# Patient Record
Sex: Female | Born: 1955 | Race: White | Hispanic: No | Smoking: Never smoker
Health system: Southern US, Community
[De-identification: ages and names within clinical notes are randomized; demographics above are authoritative.]

## PROBLEM LIST (undated history)

## (undated) DIAGNOSIS — C499 Malignant neoplasm of connective and soft tissue, unspecified: Secondary | ICD-10-CM

## (undated) DIAGNOSIS — S4290XA Fracture of unspecified shoulder girdle, part unspecified, initial encounter for closed fracture: Secondary | ICD-10-CM

## (undated) HISTORY — DX: Malignant neoplasm of connective and soft tissue, unspecified: C49.9

## (undated) HISTORY — PX: CARPAL TUNNEL RELEASE: SHX101

## (undated) HISTORY — DX: Fracture of unspecified shoulder girdle, part unspecified, initial encounter for closed fracture: S42.90XA

## (undated) HISTORY — PX: COLOSTOMY: SHX63

## (undated) HISTORY — PX: ABDOMINAL HYSTERECTOMY: SHX81

## (undated) HISTORY — PX: PARTIAL COLECTOMY: SHX5273

## (undated) HISTORY — PX: BREAST BIOPSY: SHX20

---

## 2018-07-14 ENCOUNTER — Encounter (HOSPITAL_COMMUNITY): Payer: Self-pay | Admitting: Hematology

## 2018-07-14 ENCOUNTER — Inpatient Hospital Stay (HOSPITAL_COMMUNITY): Payer: BLUE CROSS/BLUE SHIELD

## 2018-07-14 ENCOUNTER — Other Ambulatory Visit: Payer: Self-pay

## 2018-07-14 ENCOUNTER — Encounter (HOSPITAL_COMMUNITY): Payer: Self-pay

## 2018-07-14 ENCOUNTER — Other Ambulatory Visit (HOSPITAL_COMMUNITY): Payer: Self-pay | Admitting: Nurse Practitioner

## 2018-07-14 ENCOUNTER — Inpatient Hospital Stay (HOSPITAL_COMMUNITY): Payer: BLUE CROSS/BLUE SHIELD | Attending: Hematology | Admitting: Hematology

## 2018-07-14 ENCOUNTER — Ambulatory Visit (HOSPITAL_COMMUNITY)
Admission: RE | Admit: 2018-07-14 | Discharge: 2018-07-14 | Disposition: A | Discharge status: Home / Self Care | Payer: BLUE CROSS/BLUE SHIELD | Source: Ambulatory Visit | Attending: Nurse Practitioner | Admitting: Nurse Practitioner

## 2018-07-14 VITALS — BP 105/59 | HR 114 | Resp 22

## 2018-07-14 DIAGNOSIS — N1330 Unspecified hydronephrosis: Secondary | ICD-10-CM | POA: Diagnosis not present

## 2018-07-14 DIAGNOSIS — C786 Secondary malignant neoplasm of retroperitoneum and peritoneum: Secondary | ICD-10-CM | POA: Insufficient documentation

## 2018-07-14 DIAGNOSIS — Z9221 Personal history of antineoplastic chemotherapy: Secondary | ICD-10-CM | POA: Diagnosis not present

## 2018-07-14 DIAGNOSIS — R188 Other ascites: Secondary | ICD-10-CM | POA: Diagnosis not present

## 2018-07-14 DIAGNOSIS — D72829 Elevated white blood cell count, unspecified: Secondary | ICD-10-CM | POA: Diagnosis not present

## 2018-07-14 DIAGNOSIS — R7989 Other specified abnormal findings of blood chemistry: Secondary | ICD-10-CM | POA: Diagnosis not present

## 2018-07-14 DIAGNOSIS — C549 Malignant neoplasm of corpus uteri, unspecified: Secondary | ICD-10-CM

## 2018-07-14 DIAGNOSIS — Z7901 Long term (current) use of anticoagulants: Secondary | ICD-10-CM | POA: Insufficient documentation

## 2018-07-14 DIAGNOSIS — Z79899 Other long term (current) drug therapy: Secondary | ICD-10-CM | POA: Insufficient documentation

## 2018-07-14 DIAGNOSIS — M7989 Other specified soft tissue disorders: Secondary | ICD-10-CM | POA: Diagnosis not present

## 2018-07-14 DIAGNOSIS — C763 Malignant neoplasm of pelvis: Secondary | ICD-10-CM | POA: Insufficient documentation

## 2018-07-14 DIAGNOSIS — Z90722 Acquired absence of ovaries, bilateral: Secondary | ICD-10-CM | POA: Diagnosis not present

## 2018-07-14 DIAGNOSIS — L03311 Cellulitis of abdominal wall: Secondary | ICD-10-CM | POA: Diagnosis not present

## 2018-07-14 DIAGNOSIS — Z7189 Other specified counseling: Secondary | ICD-10-CM

## 2018-07-14 DIAGNOSIS — Z90710 Acquired absence of both cervix and uterus: Secondary | ICD-10-CM | POA: Insufficient documentation

## 2018-07-14 DIAGNOSIS — I82411 Acute embolism and thrombosis of right femoral vein: Secondary | ICD-10-CM | POA: Insufficient documentation

## 2018-07-14 DIAGNOSIS — J90 Pleural effusion, not elsewhere classified: Secondary | ICD-10-CM | POA: Diagnosis not present

## 2018-07-14 DIAGNOSIS — J9811 Atelectasis: Secondary | ICD-10-CM | POA: Diagnosis not present

## 2018-07-14 DIAGNOSIS — Z933 Colostomy status: Secondary | ICD-10-CM | POA: Insufficient documentation

## 2018-07-14 DIAGNOSIS — R601 Generalized edema: Secondary | ICD-10-CM | POA: Diagnosis not present

## 2018-07-14 DIAGNOSIS — R180 Malignant ascites: Secondary | ICD-10-CM

## 2018-07-14 LAB — AMYLASE: AMYLASE: 33 U/L (ref 28–100)

## 2018-07-14 LAB — CBC WITH DIFFERENTIAL/PLATELET
BASOS ABS: 0 10*3/uL (ref 0.0–0.1)
BASOS PCT: 0 %
EOS ABS: 0 10*3/uL (ref 0.0–0.7)
Eosinophils Relative: 1 %
HCT: 28.4 % — ABNORMAL LOW (ref 36.0–46.0)
Hemoglobin: 9.3 g/dL — ABNORMAL LOW (ref 12.0–15.0)
Lymphocytes Relative: 9 %
Lymphs Abs: 0.5 10*3/uL — ABNORMAL LOW (ref 0.7–4.0)
MCH: 28.7 pg (ref 26.0–34.0)
MCHC: 32.7 g/dL (ref 30.0–36.0)
MCV: 87.7 fL (ref 78.0–100.0)
MONO ABS: 0.1 10*3/uL (ref 0.1–1.0)
Monocytes Relative: 1 %
Neutro Abs: 4.3 10*3/uL (ref 1.7–7.7)
Neutrophils Relative %: 89 %
Platelets: 163 10*3/uL (ref 150–400)
RBC: 3.24 MIL/uL — ABNORMAL LOW (ref 3.87–5.11)
RDW: 15.2 % (ref 11.5–15.5)
WBC: 4.8 10*3/uL (ref 4.0–10.5)

## 2018-07-14 LAB — COMPREHENSIVE METABOLIC PANEL
ALK PHOS: 51 U/L (ref 38–126)
ALT: 16 U/L (ref 0–44)
ANION GAP: 10 (ref 5–15)
AST: 245 U/L — AB (ref 15–41)
Albumin: 2.6 g/dL — ABNORMAL LOW (ref 3.5–5.0)
BILIRUBIN TOTAL: 0.6 mg/dL (ref 0.3–1.2)
BUN: 33 mg/dL — AB (ref 8–23)
CALCIUM: 8.1 mg/dL — AB (ref 8.9–10.3)
CO2: 19 mmol/L — ABNORMAL LOW (ref 22–32)
CREATININE: 0.94 mg/dL (ref 0.44–1.00)
Chloride: 104 mmol/L (ref 98–111)
GFR calc Af Amer: >60 mL/min (ref >60)
GFR calc non Af Amer: >60 mL/min (ref >60)
GLUCOSE: 101 mg/dL — AB (ref 70–99)
Potassium: 4.3 mmol/L (ref 3.5–5.1)
Sodium: 133 mmol/L — ABNORMAL LOW (ref 135–145)
TOTAL PROTEIN: 5.8 g/dL — AB (ref 6.5–8.1)

## 2018-07-14 LAB — APTT: APTT: 34 s (ref 24–36)

## 2018-07-14 LAB — PROTIME-INR
INR: 0.97
PROTHROMBIN TIME: 12.8 s (ref 11.4–15.2)

## 2018-07-14 LAB — MAGNESIUM: MAGNESIUM: 2.2 mg/dL (ref 1.7–2.4)

## 2018-07-14 LAB — PHOSPHORUS: Phosphorus: 3.7 mg/dL (ref 2.5–4.6)

## 2018-07-14 LAB — LACTATE DEHYDROGENASE: LDH: 1266 U/L — ABNORMAL HIGH (ref 98–192)

## 2018-07-14 MED ORDER — SODIUM CHLORIDE 0.9% FLUSH
10.0000 mL | INTRAVENOUS | Status: DC | PRN
  Administered 2018-07-14: 10 mL via INTRAVENOUS
  Filled 2018-07-14: qty 10

## 2018-07-14 MED ORDER — FUROSEMIDE 20 MG PO TABS: 20.0000 mg | ORAL_TABLET | Freq: Every day | ORAL | 1 refills | Status: DC | PRN

## 2018-07-14 MED ORDER — ALBUMIN HUMAN 25 % IV SOLN
25.0000 g | Freq: Once | INTRAVENOUS | Status: AC
  Administered 2018-07-14: 25 g via INTRAVENOUS
  Filled 2018-07-14: qty 100

## 2018-07-14 NOTE — Progress Notes (Signed)
Patient returns to [place] from ultrasound. They were unable to draw off any fluid from patient.  Orders received to give albumin 50 grams per Dr. Delton Coombes.   Patient is lying in bed supine with eyes closed. She is awake but states she is just tired and wants to keep her eyes closed. Patient denies any pain at this time.  She states that she just took her morhpine that is scheduled at this time.  Patient has family at bedside.  Patient has labored respirations.    Patient has an existing central line in right chest.  Patient and daughter state that it is cared for at home by the daughter that is a Designer, jewellery.    At the request of the daughter, both lumens of central line flushed with 10 ml normal saline.  Caps replaced and tightened.    Patient tolerated infusion without incidence. Patient discharged via wheelchair with family. In stable/fair condition.  Patient educated to report to ER should her breathing get any worse.  She is following up with her primary care physician this Friday and lab work will be drawn at that time. Patient to follow up here as scheduled.

## 2018-07-14 NOTE — Assessment & Plan Note (Signed)
1.  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  CT abdomen pelvis consistent with enlarged appendix, free pelvic fluid and enlarged fibroid uterus.  She underwent laparoscopic appendectomy with large pelvic mass noted intraoperatively.  GYN oncology was consulted. -On 05/19/2018 she underwent exploratory laparotomy and resection of large pelvic mass, TAH/BSO, partial colectomy with ostomy creation, placement of bilateral ureteral stents.  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 -She was evaluated by Dr. Angelina Ok at [facility] and pathology review noted high-grade sarcomatoid malignant neoplasm with rhabdomyosarcomatous differentiation - CT CAP at [facility] treatment [facility] [place] in [place] showed high density pelvic cystic structures which could represent hemorrhage or recurrence as well as peritoneal implants/carcinomatosis. -She was evaluated on 07/06/2018 at [facility] by Dr. Manus Rudd.  A CT scan on 07/06/2018 showed significant interval increase in metastatic disease within the abdomen and pelvis compared to 06/29/2018.  Interval development of minimal left pleural effusion and left lung base atelectasis.  Multiple small bowel loops are encased by tumor which is likely producing some small bowel obstruction although there were no markedly dilated loops of small bowel. - She received first cycle of chemotherapy on 07/09/2018 with vincristine 2 mg flat dose, doxorubicin 75 mg/m bolus with dexrazoxane 750 mg/m and cyclophosphamide 1200 mg/m.  This is to be cycled every 3 weeks for a total of 6 cycles.  She had a right upper chest double-lumen catheter placed for chemotherapy administration. - She transferred her care to our center  as it is close to where she lives.  She has developed swelling in her lower extremities and abdomen for the past few days.  She gets full after eating easily.  Does not report any abdominal pain.  She does have back pain for which she takes morphine long-acting and short-acting. - I have done blood work today in my office and reviewed it with the patient and her family.  Her albumin has decreased to 2.6 from 3.4 last week. -We have arranged for an ultrasound-guided paracentesis today with the radiology.  However the ultrasound found only minimal ascites.  She could not get any paracentesis done today.  Most likely cause of her abdominal distention is her tumor. - She did receive albumin 25 g in our office.  I have started her on Lasix 20 mg for her lower extremity swelling extending up to the thighs.  She also has abdominal swelling. -She will have labs drawn at Dr. Jerene Dilling office and have them faxed to [place].  I will see her back next week to check nadir counts. -She will have her second cycle of chemotherapy on 10-Aug-2018.  We plan to repeat CT scans after that.

## 2018-07-14 NOTE — Patient Instructions (Signed)
You received albumin today.  Please keep follow up appointments as scheduled.

## 2018-07-14 NOTE — Progress Notes (Signed)
[facility] NOTE  Patient Care Team: Eber Hong, MD as PCP - General (Internal Medicine)  CHIEF COMPLAINTS/PURPOSE OF CONSULTATION:  Continuation of chemotherapy close to home.  HISTORY OF PRESENTING ILLNESS:  Krystal Baker 62 y.o. female is seen in consultation today for further work-up and management of newly diagnosed extragonadal malignant mixed mullerian tumor.  She was well until mid June of this year when she was working a full-time job and was working out 4 times a week.  She presented to the emergency department on 05/16/2018 with abdominal pain, CT scan showed enlarged appendix with free pelvic fluid and enlarged fibroid uterus.  Laparoscopy showed large pelvic mass.  On 05/19/2018 she underwent exploratory laparotomy, resection of the large pelvic mass, TAH/BSO, partial colectomy with ostomy creation, placement of bilateral ureteral stents.  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  She was seen by Dr. Angelina Ok at [facility] and the pathology was felt to have rhabdomyosarcomatous potentiation.  She was also evaluated at [facility] treatment [facility] [place] in [place].  CT scan there showed high density pelvic cystic structures which represent hemorrhage or recurrence as well as peritoneal implants/carcinomatosis.  She was evaluated at [facility] on 07/06/2018 by Dr. Manus Rudd.  A CT scan on 07/06/2018 showed significant interval increase in metastatic disease within the abdomen and pelvis compared to the CT scan on 06/29/2018 with interval development of minimal left pleural effusion and left lung base atelectasis.  Multiple small bowel loops are encased by tumor which is likely producing some small bowel obstruction.  Pathology at [facility] was consistent with extragonadal malignant mixed mullerian tumor with predominantly rhabdomyosarcomatous component.  Because of rapid progression within few weeks, she was given first cycle of chemotherapy on  07/09/2018 with vincristine 2 mg flat dose, doxorubicin 75 mg/m bolus with dexrazoxane 750 mg/m and cyclophosphamide 1200 mg/m after a double-lumen catheter was placed on her right chest.  She has tolerated chemotherapy reasonably well.  She did not experience any major issues with nausea or vomiting.  She does have some fatigue.  She complained of lower extremity swelling extending up to her upper thigh starting 3 to 4 days ago.  She gets filled up easily after eating.  She denies any abdominal pain but feels discomfort.  She has history of chronic back pain for which she is on opioids. Family history is consistent with breast cancer in her sister, leukemia in her sister, maternal aunt and maternal uncle having lung cancer (both were smokers), mother had myelofibrosis, 2 maternal uncles had unknown type of cancers. She is accompanied by her daughter Altha Harm who works as a Economist at [employer]:  Past Medical History:  Diagnosis Date  . Broken shoulder    left  . Sarcoma [facility])     SURGICAL HISTORY: Past Surgical History:  Procedure Laterality Date  . BREAST BIOPSY    . CARPAL TUNNEL RELEASE      SOCIAL HISTORY: Social History   Socioeconomic History  . Marital status: Widowed    Spouse name: Not on file  . Number of children: 2  . Years of education: Not on file  . Highest education level: Not on file  Occupational History  . Not on file  Social Needs  . Financial resource strain: Not on file  . Food insecurity:    Worry: Not on file    Inability: Not on file  . Transportation needs:    Medical: Not on file  Non-medical: Not on file  Tobacco Use  . Smoking status: Never Smoker  . Smokeless tobacco: Never Used  Substance and Sexual Activity  . Alcohol use: Not Currently  . Drug use: Never  . Sexual activity: Not Currently  Lifestyle  . Physical activity:    Days per week: Not on file    Minutes per session: Not on file   . Stress: Not on file  Relationships  . Social connections:    Talks on phone: Not on file    Gets together: Not on file    Attends religious service: Not on file    Active member of club or organization: Not on file    Attends meetings of clubs or organizations: Not on file    Relationship status: Not on file  . Intimate partner violence:    Fear of current or ex partner: Not on file    Emotionally abused: Not on file    Physically abused: Not on file    Forced sexual activity: Not on file  Other Topics Concern  . Not on file  Social History Narrative  . Not on file    FAMILY HISTORY: Family History  Problem Relation Age of Onset  . Heart attack Father   . Leukemia Sister     ALLERGIES:  has No Known Allergies.  MEDICATIONS:  Current Outpatient Medications  Medication Sig Dispense Refill  . loratadine (CLARITIN) 10 MG tablet Take by mouth.    . morphine (MS CONTIN) 15 MG 12 hr tablet Take by mouth.    . morphine (MSIR) 15 MG tablet Take by mouth.    . OLANZapine (ZYPREXA) 5 MG tablet     . omeprazole (PRILOSEC) 20 MG capsule Take by mouth.    . ondansetron (ZOFRAN) 8 MG tablet Take by mouth.    . polyethylene glycol (MIRALAX / GLYCOLAX) packet Take by mouth.    . prochlorperazine (COMPAZINE) 10 MG tablet Take by mouth.    . senna-docusate (SENOKOT-S) 8.6-50 MG tablet Take by mouth.    . sodium bicarbonate/sodium chloride SOLN Swish and spit 10 mL every 4 (four) hours while awake.  Dissolve 2 teaspoon in 1 quart of water.    . sodium chloride 0.9 % injection Inject into the vein.    Marland Kitchen sucralfate (CARAFATE) 1 GM/10ML suspension Take by mouth.    . traMADol (ULTRAM) 50 MG tablet     . vitamin C (ASCORBIC ACID) 500 MG tablet Take by mouth.    . vitamin E 400 UNIT capsule Take by mouth.    . furosemide (LASIX) 20 MG tablet Take 1 tablet (20 mg total) by mouth daily as needed. 30 tablet 1   No current facility-administered medications for this visit.     Facility-Administered Medications Ordered in Other Visits  Medication Dose Route Frequency Provider Last Rate Last Dose  . sodium chloride flush (NS) 0.9 % injection 10 mL  10 mL Intravenous PRN Derek Jack, MD   10 mL at 07/14/18 1621    REVIEW OF SYSTEMS:   Constitutional: Denies fevers, chills or abnormal night sweats.  Positive for fatigue. Eyes: Denies blurriness of vision, double vision or watery eyes Ears, nose, mouth, throat, and face: Denies mucositis or sore throat Respiratory: Positive for dyspnea on exertion.   Cardiovascular: Denies palpitation, chest discomfort or lower extremity swelling Gastrointestinal: Positive for abdominal distention in the last few days. Skin: Denies abnormal skin rashes Lymphatics: Denies new lymphadenopathy or easy bruising Neurological:Denies numbness, tingling or new  weaknesses Behavioral/Psych: Mood is stable, no new changes  All other systems were reviewed with the patient and are negative.  PHYSICAL EXAMINATION: ECOG PERFORMANCE STATUS: 2 - Symptomatic, <50% confined to bed  I have reviewed her vitals.  Blood pressure is 109/57.  Pulse rate is 113.  Respiratory to 16.  Temperature is 98.  Oxygen saturations are 96%.  GENERAL:alert, no distress and comfortable SKIN: skin color, texture, turgor are normal, no rashes or significant lesions EYES: normal, conjunctiva are pink and non-injected, sclera clear OROPHARYNX:no exudate, no erythema and lips, buccal mucosa, and tongue normal  NECK: supple, thyroid normal size, non-tender, without nodularity LYMPH:  no palpable lymphadenopathy in the cervical, axillary or inguinal LUNGS: Bilateral air entry with decreased breath sounds in the left base. HEART: Regular rate and rhythm.  Tachycardia.  Bilateral lower extremity edema 2+ present.   ABDOMEN: Soft, distended.  Colostomy bag in the left lower quadrant.  Musculoskeletal:no cyanosis of digits and no clubbing  PSYCH: alert & oriented x  3 with fluent speech NEURO: no focal motor/sensory deficits  LABORATORY DATA:  I have reviewed the data as listed Lab Results  Component Value Date   WBC 4.8 07/14/2018   HGB 9.3 (L) 07/14/2018   HCT 28.4 (L) 07/14/2018   MCV 87.7 07/14/2018   PLT 163 07/14/2018     Chemistry      Component Value Date/Time   NA 133 (L) 07/14/2018 1301   K 4.3 07/14/2018 1301   CL 104 07/14/2018 1301   CO2 19 (L) 07/14/2018 1301   BUN 33 (H) 07/14/2018 1301   CREATININE 0.94 07/14/2018 1301      Component Value Date/Time   CALCIUM 8.1 (L) 07/14/2018 1301   ALKPHOS 51 07/14/2018 1301   AST 245 (H) 07/14/2018 1301   ALT 16 07/14/2018 1301   BILITOT 0.6 07/14/2018 1301       RADIOGRAPHIC STUDIES: I have personally reviewed the radiological images as listed and agreed with the findings in the report. [place] Ascites (abdomen Limited)  Result Date: 07/14/2018 CLINICAL DATA:  Malignant mixed mullerian tumor. EXAM: LIMITED ABDOMEN ULTRASOUND FOR ASCITES TECHNIQUE: Limited ultrasound survey for ascites was performed in all four abdominal quadrants. COMPARISON:  None. FINDINGS: Minimal amount of ascites is noted in the right upper quadrant around the liver. No ascites is noted in the other 3 quadrants of the abdomen. IMPRESSION: Minimal ascites is noted.  Paracentesis was not performed. Electronically Signed   By: Marijo Conception, M.D.   On: 07/14/2018 14:54    ASSESSMENT & PLAN:  MMMT (malignant mixed Mullerian tumor) ([place]) 1.  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  CT abdomen pelvis consistent with enlarged appendix, free pelvic fluid and enlarged fibroid uterus.  She underwent laparoscopic appendectomy with large pelvic mass noted  intraoperatively.  GYN oncology was consulted. -On 05/19/2018 she underwent exploratory laparotomy and resection of large pelvic mass, TAH/BSO, partial colectomy with ostomy creation, placement of bilateral ureteral stents.  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 -She was evaluated by Dr. Angelina Ok at [facility] and pathology review noted high-grade sarcomatoid malignant neoplasm with rhabdomyosarcomatous differentiation - CT CAP at [facility] [place] in [place] showed high density pelvic cystic structures which could represent hemorrhage or  recurrence as well as peritoneal implants/carcinomatosis. -She was evaluated on 07/06/2018 at [facility] by Dr. Manus Rudd.  A CT scan on 07/06/2018 showed significant interval increase in metastatic disease within the abdomen and pelvis compared to 06/29/2018.  Interval development of minimal left pleural effusion and left lung base atelectasis.  Multiple small bowel loops are encased by tumor which is likely producing some small bowel obstruction although there were no markedly dilated loops of small bowel. - She received first cycle of chemotherapy on 07/09/2018 with vincristine 2 mg flat dose, doxorubicin 75 mg/m bolus with dexrazoxane 750 mg/m and cyclophosphamide 1200 mg/m.  This is to be cycled every 3 weeks for a total of 6 cycles.  She had a right upper chest double-lumen catheter placed for chemotherapy administration. - She transferred her care to our center as it is close to where she lives.  She has developed swelling in her lower extremities and abdomen for the past few days.  She gets full after eating easily.  Does not report any abdominal pain.  She does have back pain for which she takes morphine long-acting and short-acting. - I have done blood work today in my office and reviewed it with the patient and her family.  Her albumin has decreased to 2.6 from 3.4 last week. -We have arranged for an ultrasound-guided paracentesis  today with the radiology.  However the ultrasound found only minimal ascites.  She could not get any paracentesis done today.  Most likely cause of her abdominal distention is her tumor. - She did receive albumin 25 g in our office.  I have started her on Lasix 20 mg for her lower extremity swelling extending up to the thighs.  She also has abdominal swelling. -She will have labs drawn at Dr. Jerene Dilling office and have them faxed to [place].  I will see her back next week to check nadir counts. -She will have her second cycle of chemotherapy on 08-15-2018.  We plan to repeat CT scans after that.  Orders Placed This Encounter  Procedures  . Magnesium    Standing Status:   Future    Number of Occurrences:   1    Standing Expiration Date:   07/15/2019  . CBC with Differential/Platelet    Standing Status:   Future    Number of Occurrences:   1    Standing Expiration Date:   07/15/2019  . Comprehensive metabolic panel    Standing Status:   Future    Number of Occurrences:   1    Standing Expiration Date:   07/15/2019  . Lactate dehydrogenase    Standing Status:   Future    Number of Occurrences:   1    Standing Expiration Date:   07/14/2019  . Phosphorus    Standing Status:   Future    Number of Occurrences:   1    Standing Expiration Date:   07/15/2019  . CA 125    Standing Status:   Future    Number of Occurrences:   1    Standing Expiration Date:   07/14/2019  . CEA    Standing Status:   Future    Number of Occurrences:   1    Standing Expiration Date:   07/14/2019  . APTT    Standing Status:   Future    Number of Occurrences:   1    Standing Expiration Date:   07/14/2019  . Protime-INR    Standing Status:   Future  Number of Occurrences:   1    Standing Expiration Date:   07/14/2019  . Amylase    Standing Status:   Future    Number of Occurrences:   1    Standing Expiration Date:   07/15/2019    All questions were answered. The patient knows to call the clinic with any problems,  questions or concerns.     Derek Jack, MD 07/14/2018 5:27 PM

## 2018-07-14 NOTE — Patient Instructions (Addendum)
[facility] at [facility] Discharge Instructions  You saw Dr. Katragadda today.   Thank you for choosing [facility] at [facility] to provide your oncology and hematology care.  To afford each patient quality time with our provider, please arrive at least 15 minutes before your scheduled appointment time.   If you have a lab appointment with the Cancer Center please come in thru the  Main Entrance and check in at the main information desk  You need to re-schedule your appointment should you arrive 10 or more minutes late.  We strive to give you quality time with our providers, and arriving late affects you and other patients whose appointments are after yours.  Also, if you no show three or more times for appointments you may be dismissed from the clinic at the providers discretion.     Again, thank you for choosing [facility].  Our hope is that these requests will decrease the amount of time that you wait before being seen by our physicians.       _____________________________________________________________  Should you have questions after your visit to [facility], please contact our office at [phone] between the hours of 8:00 a.m. and 4:30 p.m.  Voicemails left after 4:00 p.m. will not be returned until the following business day.  For prescription refill requests, have your pharmacy contact our office and allow 72 hours.    Cancer Center Support Programs:   > Cancer Support Group  2nd Tuesday of the month 1pm-2pm, Journey Room    

## 2018-07-15 ENCOUNTER — Encounter (HOSPITAL_COMMUNITY): Payer: Self-pay | Admitting: *Deleted

## 2018-07-15 LAB — CA 125: Cancer Antigen (CA) 125: 400 U/mL — ABNORMAL HIGH (ref 0.0–38.1)

## 2018-07-15 LAB — CEA: CEA: 0.6 ng/mL (ref 0.0–4.7)

## 2018-07-15 NOTE — Progress Notes (Signed)
I called Dr. Brynda Greathouse [phone]) in [place], [place] to ask that they draw labs on Mrs. Davalos at her appointment on Friday.  I spoke with Willia Craze, RN and asked that they fax [place] the results. I asked that they draw CBC w/diff, CMP, LDH, Magnesium and Phosphorus per Dr. Delton Coombes.

## 2018-07-16 ENCOUNTER — Encounter: Payer: Self-pay | Admitting: General Practice

## 2018-07-16 ENCOUNTER — Inpatient Hospital Stay (HOSPITAL_COMMUNITY): Payer: BLUE CROSS/BLUE SHIELD

## 2018-07-16 ENCOUNTER — Telehealth (HOSPITAL_COMMUNITY): Payer: Self-pay | Admitting: *Deleted

## 2018-07-16 VITALS — BP 116/61 | HR 115 | Temp 98.3°F | Resp 18

## 2018-07-16 DIAGNOSIS — C763 Malignant neoplasm of pelvis: Secondary | ICD-10-CM | POA: Diagnosis not present

## 2018-07-16 DIAGNOSIS — C549 Malignant neoplasm of corpus uteri, unspecified: Secondary | ICD-10-CM

## 2018-07-16 DIAGNOSIS — R110 Nausea: Secondary | ICD-10-CM

## 2018-07-16 LAB — COMPREHENSIVE METABOLIC PANEL
ALK PHOS: 39 U/L (ref 38–126)
ALT: 15 U/L (ref 0–44)
AST: 175 U/L — ABNORMAL HIGH (ref 15–41)
Albumin: 2.9 g/dL — ABNORMAL LOW (ref 3.5–5.0)
Anion gap: 10 (ref 5–15)
BUN: 28 mg/dL — AB (ref 8–23)
CALCIUM: 8 mg/dL — AB (ref 8.9–10.3)
CHLORIDE: 104 mmol/L (ref 98–111)
CO2: 20 mmol/L — AB (ref 22–32)
CREATININE: 0.92 mg/dL (ref 0.44–1.00)
GFR calc non Af Amer: >60 mL/min (ref >60)
Glucose, Bld: 109 mg/dL — ABNORMAL HIGH (ref 70–99)
Potassium: 4.1 mmol/L (ref 3.5–5.1)
SODIUM: 134 mmol/L — AB (ref 135–145)
Total Bilirubin: 0.8 mg/dL (ref 0.3–1.2)
Total Protein: 5.8 g/dL — ABNORMAL LOW (ref 6.5–8.1)

## 2018-07-16 LAB — CBC WITH DIFFERENTIAL/PLATELET
BASOS PCT: 0 %
Basophils Absolute: 0 10*3/uL (ref 0.0–0.1)
EOS ABS: 0 10*3/uL (ref 0.0–0.7)
EOS PCT: 5 %
HCT: 26.3 % — ABNORMAL LOW (ref 36.0–46.0)
Hemoglobin: 8.6 g/dL — ABNORMAL LOW (ref 12.0–15.0)
LYMPHS ABS: 0.2 10*3/uL (ref 0.7–4.0)
Lymphocytes Relative: 90 %
MCH: 28.2 pg (ref 26.0–34.0)
MCHC: 32.7 g/dL (ref 30.0–36.0)
MCV: 86.2 fL (ref 78.0–100.0)
MONO ABS: 0 10*3/uL (ref 0.1–1.0)
MONOS PCT: 0 %
Neutro Abs: 0 10*3/uL (ref 1.7–7.7)
Neutrophils Relative %: 5 %
PLATELETS: 71 10*3/uL — AB (ref 150–400)
RBC: 3.05 MIL/uL — ABNORMAL LOW (ref 3.87–5.11)
RDW: 14.9 % (ref 11.5–15.5)
WBC: 0.2 10*3/uL — CL (ref 4.0–10.5)

## 2018-07-16 LAB — MAGNESIUM: MAGNESIUM: 2.2 mg/dL (ref 1.7–2.4)

## 2018-07-16 LAB — LACTATE DEHYDROGENASE: LDH: 1237 U/L — ABNORMAL HIGH (ref 98–192)

## 2018-07-16 MED ORDER — SODIUM CHLORIDE 0.9% FLUSH
10.0000 mL | Freq: Once | INTRAVENOUS | Status: AC
  Administered 2018-07-16: 10 mL via INTRAVENOUS

## 2018-07-16 MED ORDER — SCOPOLAMINE 1 MG/3DAYS TD PT72
1.0000 | MEDICATED_PATCH | TRANSDERMAL | Status: DC
  Administered 2018-07-16: 1.5 mg via TRANSDERMAL
  Filled 2018-07-16: qty 1

## 2018-07-16 MED ORDER — SODIUM CHLORIDE 0.9 % IV SOLN
INTRAVENOUS | Status: DC
  Administered 2018-07-16: 500 mL via INTRAVENOUS

## 2018-07-16 MED ORDER — SCOPOLAMINE 1 MG/3DAYS TD PT72: 1.0000 | MEDICATED_PATCH | TRANSDERMAL | 12 refills | Status: DC

## 2018-07-16 MED ORDER — HEPARIN SOD (PORK) LOCK FLUSH 100 UNIT/ML IV SOLN
500.0000 [IU] | Freq: Once | INTRAVENOUS | Status: AC
  Administered 2018-07-16: 500 [IU] via INTRAVENOUS

## 2018-07-16 NOTE — Patient Instructions (Signed)
Krystal Baker at [facility] Discharge Instructions Call for any questions   Thank you for choosing [facility] to provide your oncology and hematology care.  To afford each patient quality time with our provider, please arrive at least 15 minutes before your scheduled appointment time.   If you have a lab appointment with the [facility] please come in thru the  Main Entrance and check in at the main information desk  You need to re-schedule your appointment should you arrive 10 or more minutes late.  We strive to give you quality time with our providers, and arriving late affects you and other patients whose appointments are after yours.  Also, if you no show three or more times for appointments you may be dismissed from the clinic at the providers discretion.     Again, thank you for choosing [facility].  Our hope is that these requests will decrease the amount of time that you wait before being seen by our physicians.       _____________________________________________________________  Should you have questions after your visit to [facility], please contact our office at [phone] between the hours of 8:00 a.m. and 4:30 p.m.  Voicemails left after 4:00 p.m. will not be returned until the following business day.  For prescription refill requests, have your pharmacy contact our office and allow 72 hours.    Cancer Center Support Programs:   > Cancer Support Group  2nd Tuesday of the month 1pm-2pm, Journey Room

## 2018-07-16 NOTE — Progress Notes (Signed)
[place] Psychosocial Distress Screening Clinical Social Work  Clinical Social Work was referred by distress screening protocol.  The patient scored a 10 on the Psychosocial Distress Thermometer which indicates severe distress. Clinical Social Worker contacted patient by phone to assess for distress and other psychosocial needs. Spoke w patient in car as she was on way to [facility] for tx today.  Has significant fatigue "its hard to even talk to you." Feels physically unable to access support resources including group - "when I feel better, that sounds good."  CSW will mail information packet to patient so she can review when able, encouraged her to call as needed for support and resources.    ONCBCN DISTRESS SCREENING 07/14/2018  Screening Type Initial Screening  Distress experienced in past week (1-10) 10  Emotional problem type Nervousness/Anxiety;Adjusting to illness  Physical Problem type Pain;Nausea/vomiting;Sleep/insomnia;Getting around;Bathing/dressing;Breathing;Loss of appetitie;Swollen arms/legs  Physician notified of physical symptoms Yes  Referral to clinical psychology No  Referral to clinical social work Yes  Referral to dietition No  Referral to financial advocate No  Referral to support programs No  Referral to palliative care No    Clinical Social Worker follow up needed: Yes.    Continue to monitor for needs.    If yes, follow up plan:  Beverely Pace, [place], LCSW Clinical Social Worker Phone:  [phone]

## 2018-07-16 NOTE — Progress Notes (Signed)
Patient assisted to room 410 with family at side.  Labs drawn from central line.  Patient resting with no s/s of distress noted.   CRITICAL VALUE STICKER  CRITICAL VALUE: WBC .2  RECEIVER (on-site recipient of call): Charlyne Petrin, RN  DATE & TIME NOTIFIED: 07/16/2018 1215  MESSENGER (representative from lab): Harland German  MD NOTIFIED: Delton Coombes  TIME OF NOTIFICATION:1225  RESPONSE: Patient received Neulasta in MD [place].  No orders for WBC count management today.  Patient to return on Monday for lab recheck.  Give a scopolamine patch today and send script to pharmacy verbal order Dr. Delton Coombes.   Patients daughter spoke with Dr. Delton Coombes on the phone for follow up care and white blood cell counts.   Patient tolerated hydration with no complaints voiced.  Scopolamine patch applied with script sent to pharmacy.  Reviewed neutropenia precautions with the patient and family with understanding verbalized.  Patient left by wheelchair with family with no s/s of distress noted.

## 2018-07-16 NOTE — Telephone Encounter (Signed)
Pt's daughter called stating that the pt can't keep anything down and has been that way since Tuesday afternoon. She stated that the pt is on Lasix and she is worried that the pt will get dehydrated.   I spoke to Dr. Raliegh Ip about above and he advised to give the pt a scope patch, make sure she is taking the nausea medications that have been given to her and to find out if the Lasix seems to be working.   Christin stated that the Lasix is working and that the pt is taking nausea medication at home. Francene Finders NP was going to send in the rx for the scope patch.  Pt was worked in for fluids today and is currently at our clinic.

## 2018-07-17 ENCOUNTER — Other Ambulatory Visit (HOSPITAL_COMMUNITY): Payer: Self-pay

## 2018-07-17 DIAGNOSIS — C549 Malignant neoplasm of corpus uteri, unspecified: Secondary | ICD-10-CM

## 2018-07-18 ENCOUNTER — Other Ambulatory Visit: Payer: Self-pay

## 2018-07-18 ENCOUNTER — Other Ambulatory Visit (HOSPITAL_COMMUNITY): Payer: Self-pay

## 2018-07-18 ENCOUNTER — Encounter (HOSPITAL_COMMUNITY): Payer: Self-pay | Admitting: Emergency Medicine

## 2018-07-18 ENCOUNTER — Inpatient Hospital Stay (HOSPITAL_COMMUNITY)
Admission: EM | Admit: 2018-07-18 | Discharge: 2018-07-21 | DRG: 863 | Disposition: A | Discharge status: Home / Self Care | Payer: BLUE CROSS/BLUE SHIELD | Attending: Internal Medicine | Admitting: Internal Medicine

## 2018-07-18 ENCOUNTER — Emergency Department (HOSPITAL_COMMUNITY): Payer: BLUE CROSS/BLUE SHIELD

## 2018-07-18 DIAGNOSIS — N179 Acute kidney failure, unspecified: Secondary | ICD-10-CM | POA: Diagnosis present

## 2018-07-18 DIAGNOSIS — E872 Acidosis, unspecified: Secondary | ICD-10-CM | POA: Diagnosis present

## 2018-07-18 DIAGNOSIS — Z79899 Other long term (current) drug therapy: Secondary | ICD-10-CM | POA: Diagnosis not present

## 2018-07-18 DIAGNOSIS — Z933 Colostomy status: Secondary | ICD-10-CM | POA: Diagnosis not present

## 2018-07-18 DIAGNOSIS — R1900 Intra-abdominal and pelvic swelling, mass and lump, unspecified site: Secondary | ICD-10-CM | POA: Diagnosis present

## 2018-07-18 DIAGNOSIS — C799 Secondary malignant neoplasm of unspecified site: Secondary | ICD-10-CM

## 2018-07-18 DIAGNOSIS — I824Y1 Acute embolism and thrombosis of unspecified deep veins of right proximal lower extremity: Secondary | ICD-10-CM | POA: Diagnosis not present

## 2018-07-18 DIAGNOSIS — D701 Agranulocytosis secondary to cancer chemotherapy: Secondary | ICD-10-CM | POA: Diagnosis present

## 2018-07-18 DIAGNOSIS — T8149XA Infection following a procedure, other surgical site, initial encounter: Secondary | ICD-10-CM | POA: Diagnosis present

## 2018-07-18 DIAGNOSIS — Z79891 Long term (current) use of opiate analgesic: Secondary | ICD-10-CM | POA: Diagnosis not present

## 2018-07-18 DIAGNOSIS — T451X5A Adverse effect of antineoplastic and immunosuppressive drugs, initial encounter: Secondary | ICD-10-CM | POA: Diagnosis present

## 2018-07-18 DIAGNOSIS — R600 Localized edema: Secondary | ICD-10-CM

## 2018-07-18 DIAGNOSIS — Z9049 Acquired absence of other specified parts of digestive tract: Secondary | ICD-10-CM | POA: Diagnosis not present

## 2018-07-18 DIAGNOSIS — R601 Generalized edema: Secondary | ICD-10-CM | POA: Diagnosis not present

## 2018-07-18 DIAGNOSIS — Z7401 Bed confinement status: Secondary | ICD-10-CM | POA: Diagnosis not present

## 2018-07-18 DIAGNOSIS — L03311 Cellulitis of abdominal wall: Secondary | ICD-10-CM | POA: Diagnosis present

## 2018-07-18 DIAGNOSIS — I82401 Acute embolism and thrombosis of unspecified deep veins of right lower extremity: Secondary | ICD-10-CM | POA: Diagnosis present

## 2018-07-18 DIAGNOSIS — Z66 Do not resuscitate: Secondary | ICD-10-CM | POA: Diagnosis present

## 2018-07-18 DIAGNOSIS — R112 Nausea with vomiting, unspecified: Secondary | ICD-10-CM | POA: Diagnosis not present

## 2018-07-18 DIAGNOSIS — E8809 Other disorders of plasma-protein metabolism, not elsewhere classified: Secondary | ICD-10-CM | POA: Diagnosis present

## 2018-07-18 DIAGNOSIS — Z90710 Acquired absence of both cervix and uterus: Secondary | ICD-10-CM

## 2018-07-18 DIAGNOSIS — I82411 Acute embolism and thrombosis of right femoral vein: Secondary | ICD-10-CM | POA: Diagnosis present

## 2018-07-18 DIAGNOSIS — C549 Malignant neoplasm of corpus uteri, unspecified: Secondary | ICD-10-CM | POA: Diagnosis present

## 2018-07-18 DIAGNOSIS — C499 Malignant neoplasm of connective and soft tissue, unspecified: Secondary | ICD-10-CM | POA: Diagnosis not present

## 2018-07-18 DIAGNOSIS — Z90722 Acquired absence of ovaries, bilateral: Secondary | ICD-10-CM | POA: Diagnosis not present

## 2018-07-18 DIAGNOSIS — C786 Secondary malignant neoplasm of retroperitoneum and peritoneum: Secondary | ICD-10-CM | POA: Diagnosis present

## 2018-07-18 DIAGNOSIS — Z806 Family history of leukemia: Secondary | ICD-10-CM | POA: Diagnosis not present

## 2018-07-18 LAB — CBC WITH DIFFERENTIAL/PLATELET
BASOS ABS: 0 10*3/uL (ref 0.0–0.1)
BASOS PCT: 0 %
EOS ABS: 0 10*3/uL (ref 0.0–0.7)
Eosinophils Relative: 2 %
HCT: 25.7 % — ABNORMAL LOW (ref 36.0–46.0)
HEMOGLOBIN: 8.4 g/dL — AB (ref 12.0–15.0)
Lymphocytes Relative: 42 %
Lymphs Abs: 0.3 10*3/uL (ref 0.7–4.0)
MCH: 27.8 pg (ref 26.0–34.0)
MCHC: 32.7 g/dL (ref 30.0–36.0)
MCV: 85.1 fL (ref 78.0–100.0)
Monocytes Absolute: 0.2 10*3/uL (ref 0.1–1.0)
Monocytes Relative: 32 %
Neutro Abs: 0.2 10*3/uL (ref 1.7–7.7)
Neutrophils Relative %: 24 %
Platelets: 68 10*3/uL — ABNORMAL LOW (ref 150–400)
RBC: 3.02 MIL/uL — AB (ref 3.87–5.11)
RDW: 14.8 % (ref 11.5–15.5)
WBC: 0.7 10*3/uL — AB (ref 4.0–10.5)

## 2018-07-18 LAB — COMPREHENSIVE METABOLIC PANEL
ALK PHOS: 40 U/L (ref 38–126)
ALT: 15 U/L (ref 0–44)
AST: 161 U/L — ABNORMAL HIGH (ref 15–41)
Albumin: 2.7 g/dL — ABNORMAL LOW (ref 3.5–5.0)
Anion gap: 11 (ref 5–15)
BUN: 34 mg/dL — ABNORMAL HIGH (ref 8–23)
CALCIUM: 8.2 mg/dL — AB (ref 8.9–10.3)
CO2: 20 mmol/L — ABNORMAL LOW (ref 22–32)
CREATININE: 1.24 mg/dL — AB (ref 0.44–1.00)
Chloride: 103 mmol/L (ref 98–111)
GFR, EST AFRICAN AMERICAN: 53 mL/min — AB (ref >60)
GFR, EST NON AFRICAN AMERICAN: 46 mL/min — AB (ref >60)
Glucose, Bld: 96 mg/dL (ref 70–99)
Potassium: 4.1 mmol/L (ref 3.5–5.1)
Sodium: 134 mmol/L — ABNORMAL LOW (ref 135–145)
TOTAL PROTEIN: 5.9 g/dL — AB (ref 6.5–8.1)
Total Bilirubin: 0.6 mg/dL (ref 0.3–1.2)

## 2018-07-18 LAB — URINALYSIS, ROUTINE W REFLEX MICROSCOPIC
BILIRUBIN URINE: NEGATIVE
Glucose, UA: NEGATIVE mg/dL
HGB URINE DIPSTICK: NEGATIVE
KETONES UR: NEGATIVE mg/dL
LEUKOCYTES UA: NEGATIVE
Nitrite: NEGATIVE
PH: 5 (ref 5.0–8.0)
Protein, ur: 30 mg/dL — AB
Specific Gravity, Urine: 1.018 (ref 1.005–1.030)

## 2018-07-18 LAB — I-STAT CG4 LACTIC ACID, ED
Lactic Acid, Venous: 2.58 mmol/L (ref 0.5–1.9)
Lactic Acid, Venous: 1.69 mmol/L (ref 0.5–1.9)

## 2018-07-18 MED ORDER — PROCHLORPERAZINE MALEATE 5 MG PO TABS
10.0000 mg | ORAL_TABLET | Freq: Four times a day (QID) | ORAL | Status: DC
  Administered 2018-07-19 – 2018-07-21 (×11): 10 mg via ORAL
  Filled 2018-07-18 (×11): qty 2

## 2018-07-18 MED ORDER — SODIUM BICARBONATE/SODIUM CHLORIDE MOUTHWASH
OROMUCOSAL | Status: DC | PRN
  Filled 2018-07-18: qty 1000

## 2018-07-18 MED ORDER — LORATADINE 10 MG PO TABS: 10.0000 mg | ORAL_TABLET | Freq: Every day | ORAL | Status: DC | PRN

## 2018-07-18 MED ORDER — SODIUM CHLORIDE 0.9 % IV SOLN
2.0000 g | Freq: Once | INTRAVENOUS | Status: AC
  Administered 2018-07-18: 2 g via INTRAVENOUS
  Filled 2018-07-18: qty 2

## 2018-07-18 MED ORDER — POLYETHYLENE GLYCOL 3350 17 G PO PACK: 17.0000 g | PACK | Freq: Every day | ORAL | Status: DC | PRN

## 2018-07-18 MED ORDER — TRAMADOL HCL 50 MG PO TABS: 50.0000 mg | ORAL_TABLET | Freq: Four times a day (QID) | ORAL | Status: DC | PRN

## 2018-07-18 MED ORDER — IOPAMIDOL (ISOVUE-300) INJECTION 61%
100.0000 mL | Freq: Once | INTRAVENOUS | Status: AC | PRN
  Administered 2018-07-18: 100 mL via INTRAVENOUS

## 2018-07-18 MED ORDER — VANCOMYCIN HCL IN DEXTROSE 750-5 MG/150ML-% IV SOLN
750.0000 mg | Freq: Two times a day (BID) | INTRAVENOUS | Status: DC
  Filled 2018-07-18 (×3): qty 150

## 2018-07-18 MED ORDER — ACETAMINOPHEN 325 MG PO TABS: 650.0000 mg | ORAL_TABLET | Freq: Four times a day (QID) | ORAL | Status: DC | PRN

## 2018-07-18 MED ORDER — SODIUM CHLORIDE 0.9 % IV BOLUS
1000.0000 mL | Freq: Once | INTRAVENOUS | Status: AC
  Administered 2018-07-18: 1000 mL via INTRAVENOUS

## 2018-07-18 MED ORDER — METRONIDAZOLE IN NACL 5-0.79 MG/ML-% IV SOLN
500.0000 mg | Freq: Three times a day (TID) | INTRAVENOUS | Status: DC
Start: 2018-07-18 — End: 2018-07-19
  Administered 2018-07-18 – 2018-07-19 (×2): 500 mg via INTRAVENOUS
  Filled 2018-07-18 (×2): qty 100

## 2018-07-18 MED ORDER — ONDANSETRON HCL 4 MG/2ML IJ SOLN
4.0000 mg | Freq: Once | INTRAMUSCULAR | Status: AC
  Administered 2018-07-18: 4 mg via INTRAVENOUS
  Filled 2018-07-18: qty 2

## 2018-07-18 MED ORDER — SODIUM CHLORIDE 0.9 % IV SOLN
INTRAVENOUS | Status: DC
  Administered 2018-07-19: via INTRAVENOUS

## 2018-07-18 MED ORDER — SENNOSIDES-DOCUSATE SODIUM 8.6-50 MG PO TABS
1.0000 | ORAL_TABLET | Freq: Every evening | ORAL | Status: DC | PRN
  Administered 2018-07-20: 1 via ORAL
  Filled 2018-07-18: qty 1

## 2018-07-18 MED ORDER — FAMOTIDINE IN NACL 20-0.9 MG/50ML-% IV SOLN
20.0000 mg | Freq: Two times a day (BID) | INTRAVENOUS | Status: DC
  Administered 2018-07-19 – 2018-07-20 (×3): 20 mg via INTRAVENOUS
  Filled 2018-07-18 (×3): qty 50

## 2018-07-18 MED ORDER — ACETAMINOPHEN 650 MG RE SUPP: 650.0000 mg | Freq: Four times a day (QID) | RECTAL | Status: DC | PRN

## 2018-07-18 MED ORDER — ENOXAPARIN SODIUM 40 MG/0.4ML ~~LOC~~ SOLN
40.0000 mg | SUBCUTANEOUS | Status: DC
  Administered 2018-07-19: 40 mg via SUBCUTANEOUS
  Filled 2018-07-18: qty 0.4

## 2018-07-18 MED ORDER — MORPHINE SULFATE (PF) 4 MG/ML IV SOLN
8.0000 mg | Freq: Once | INTRAVENOUS | Status: AC
Start: 2018-07-18 — End: 2018-07-18
  Administered 2018-07-18: 8 mg via INTRAVENOUS
  Filled 2018-07-18: qty 2

## 2018-07-18 MED ORDER — SCOPOLAMINE 1 MG/3DAYS TD PT72
1.0000 | MEDICATED_PATCH | TRANSDERMAL | Status: DC
  Filled 2018-07-18: qty 1

## 2018-07-18 MED ORDER — OLANZAPINE 5 MG PO TABS
5.0000 mg | ORAL_TABLET | Freq: Two times a day (BID) | ORAL | Status: DC
  Administered 2018-07-19 – 2018-07-21 (×6): 5 mg via ORAL
  Filled 2018-07-18 (×7): qty 1

## 2018-07-18 MED ORDER — ONDANSETRON HCL 4 MG PO TABS
8.0000 mg | ORAL_TABLET | Freq: Three times a day (TID) | ORAL | Status: DC | PRN
  Administered 2018-07-20: 8 mg via ORAL
  Filled 2018-07-18: qty 2

## 2018-07-18 MED ORDER — METOCLOPRAMIDE HCL 5 MG/ML IJ SOLN
10.0000 mg | Freq: Four times a day (QID) | INTRAMUSCULAR | Status: DC
Start: 2018-07-19 — End: 2018-07-21
  Administered 2018-07-19 – 2018-07-21 (×11): 10 mg via INTRAVENOUS
  Filled 2018-07-18 (×11): qty 2

## 2018-07-18 MED ORDER — TBO-FILGRASTIM 480 MCG/0.8ML ~~LOC~~ SOSY
480.0000 ug | PREFILLED_SYRINGE | Freq: Once | SUBCUTANEOUS | Status: DC
  Filled 2018-07-18: qty 0.8

## 2018-07-18 MED ORDER — TAMSULOSIN HCL 0.4 MG PO CAPS
0.4000 mg | ORAL_CAPSULE | Freq: Every day | ORAL | Status: DC
  Administered 2018-07-21: 0.4 mg via ORAL
  Filled 2018-07-18 (×3): qty 1

## 2018-07-18 MED ORDER — PNEUMOCOCCAL VAC POLYVALENT 25 MCG/0.5ML IJ INJ
0.5000 mL | INJECTION | INTRAMUSCULAR | Status: DC
  Filled 2018-07-18: qty 0.5

## 2018-07-18 MED ORDER — SODIUM CHLORIDE 0.9 % IV SOLN
1.0000 g | Freq: Two times a day (BID) | INTRAVENOUS | Status: DC
  Administered 2018-07-19: 1 g via INTRAVENOUS
  Filled 2018-07-18 (×3): qty 1

## 2018-07-18 MED ORDER — MORPHINE SULFATE ER 15 MG PO TBCR
15.0000 mg | EXTENDED_RELEASE_TABLET | Freq: Two times a day (BID) | ORAL | Status: DC
Start: 2018-07-18 — End: 2018-07-21
  Administered 2018-07-19 – 2018-07-21 (×6): 15 mg via ORAL
  Filled 2018-07-18 (×5): qty 1

## 2018-07-18 MED ORDER — VANCOMYCIN HCL IN DEXTROSE 1-5 GM/200ML-% IV SOLN
1000.0000 mg | Freq: Once | INTRAVENOUS | Status: AC
  Administered 2018-07-18: 1000 mg via INTRAVENOUS
  Filled 2018-07-18: qty 200

## 2018-07-18 NOTE — ED Triage Notes (Addendum)
Patient c/o abscess and possible infection to her stoma. Per patient she noticed the tenderness and warmth to touch yesterday. Patient unsure of any drainage or fevers. Ostomy bag covering abscess. Redness to older incision site on abd and not near abscess. Streaking noted. Patient started having nausea and vomiting on Tuesday per family but was able to keep food down. Denies any nausea at this time. Patient's WBC 0.2 at cancer center yesterday and patient has large amount of swelling in abd and lower extremities in which she was placed on 20mg  of lasix.

## 2018-07-18 NOTE — H&P (Addendum)
History and Physical  Davan Hark PIR:[id number] DOB: January 30, 1956 DOA: 07/18/2018  Referring physician: Dr Alvino Chapel, ED physician PCP: Eber Hong, MD  Outpatient Specialists:  Dr Delton Coombes (Oncology)  Patient Coming From: home  Chief Complaint: abdominal redness  HPI: Elmina Hendel is a 62 y.o. 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  Patient seen for and that was seen earlier today that is medial to her stoma.  Skin is red and warm to the touch was not present 3 to 4 days ago when the stoma bag was changed.  Symptoms are worsening.  Denies fevers.  Additionally, the patient is having quite a bit of abdominal distention, discomfort, nausea, vomiting.  She was sent for a paracentesis, although it was not enough fluid to drain.  She does have significant nausea and vomiting prolamine patch, and Zofran, Compazine.  Emergency Department Course: Blood cultures obtained.  0.7 which is increased from 0.2  Four days ago. Cr 1.24.  Lactic acid 2.58.  CT abdomen pelvis shows pelvic mass up to 22 cm with extensive peritoneal carcinomatosis and moderate volume of ascites.  No abscess seen.  Patient started on cefepime, Vanco, Flagyl.  Review of Systems:   Pt denies any fevers, chills, diarrhea, constipation, abdominal pain, shortness of breath, dyspnea on exertion, orthopnea, cough, wheezing, palpitations, headache, vision changes, lightheadedness, dizziness, melena, rectal bleeding.  Review of systems are otherwise negative  Past Medical History:  Diagnosis Date  . Broken shoulder    left  . [facility])    Past Surgical History:  Procedure Laterality Date  . BREAST BIOPSY    . CARPAL TUNNEL RELEASE     Social History:  reports that she has never smoked. She has never used smokeless tobacco. She reports that she  drank alcohol. She reports that she does not use drugs. Patient lives at home  No Known Allergies  Family History  Problem Relation Age of Onset  . Heart attack Father   . Leukemia Sister       Prior to Admission medications   Medication Sig Start Date End Date Taking? Authorizing Provider  furosemide (LASIX) 20 MG tablet Take 1 tablet (20 mg total) by mouth daily as needed. 07/14/18  Yes Lockamy, Randi L, NP-C  OLANZapine (ZYPREXA) 5 MG tablet Take 5 mg by mouth 2 (two) times daily.  07/11/18  Yes [provider]  scopolamine (TRANSDERM-SCOP) 1 MG/3DAYS Place 1 patch (1.5 mg total) onto the skin every 3 (three) days. 07/16/18  Yes Derek Jack, MD  tamsulosin (FLOMAX) 0.4 MG CAPS capsule Take 0.4 mg by mouth daily.   Yes [provider]  traMADol (ULTRAM) 50 MG tablet Take 50 mg by mouth every 6 (six) hours as needed for moderate pain.  07/06/18  Yes [provider]  loratadine (CLARITIN) 10 MG tablet Take by mouth.    [provider]  morphine (MS CONTIN) 15 MG 12 hr tablet Take by mouth. 07/10/18   [provider]  morphine (MSIR) 15 MG tablet Take by mouth. 07/10/18   [provider]  omeprazole (PRILOSEC) 20 MG capsule Take by mouth.    [provider]  ondansetron (ZOFRAN) 8 MG tablet Take by mouth. 07/08/18   [provider]  polyethylene glycol (MIRALAX / GLYCOLAX) packet Take by mouth. 07/11/18   [provider]  prochlorperazine (COMPAZINE)  10 MG tablet Take by mouth. 07/08/18   [provider]  senna-docusate (SENOKOT-S) 8.6-50 MG tablet Take by mouth. 07/08/18   [provider]  sodium bicarbonate/sodium chloride SOLN Swish and spit 10 mL every 4 (four) hours while awake.  Dissolve 2 teaspoon in 1 quart of water. 07/08/18   [provider]  sodium chloride 0.9 % injection Inject into the vein. 07/08/18   [provider]  sucralfate (CARAFATE) 1 GM/10ML suspension Take by  mouth. 07/08/18   [provider]  vitamin C (ASCORBIC ACID) 500 MG tablet Take by mouth.    [provider]  vitamin E 400 UNIT capsule Take by mouth.    [provider]    Physical Exam: BP 124/72   Pulse (!) 117   Temp 98.8 F (37.1 C) (Oral)   Resp (!) 27   Ht 5' 1.5" (1.562 m)   Wt 72.6 kg   SpO2 92%   BMI 29.74 kg/m   . General: 62 year old Caucasian female. Awake and alert and oriented x3. No acute cardiopulmonary distress.  Marland Kitchen HEENT: Normocephalic atraumatic.  Right and left ears normal in appearance.  Pupils equal, round, reactive to light. Extraocular muscles are intact. Sclerae anicteric and noninjected.  Moist mucosal membranes. No mucosal lesions.  . Neck: Neck supple without lymphadenopathy. No carotid bruits. No masses palpated.  . Cardiovascular: Regular rate with normal S1-S2 sounds. No murmurs, rubs, gallops auscultated. No JVD.  Marland Kitchen Respiratory: Good respiratory effort with no wheezes, rales, rhonchi.  Diminished breath sounds in the bases bilaterally.  Lungs clear to auscultation bilaterally.  No accessory muscle use. . Abdomen: Soft, mild tenderness diffusely without rebound.  Moderate distention.  Slight erythema along the medial border of the stoma bag, which is hot to the touch. Active bowel sounds. No masses or hepatosplenomegaly  . Skin: No rashes, lesions, or ulcerations.  Dry, warm to touch. 2+ dorsalis pedis and radial pulses. . Musculoskeletal: No calf or leg pain. All major joints not erythematous nontender.  No upper or lower joint deformation.  Good ROM.  No contractures  . Psychiatric: Intact judgment and insight. Pleasant and cooperative. . Neurologic: No focal neurological deficits. Strength is 5/5 and symmetric in upper and lower extremities.  Cranial nerves II through XII are grossly intact.           Labs on Admission: I have personally reviewed following labs and imaging studies  CBC: Recent Labs  Lab 07/14/18 1301  07/16/18 1054 07/18/18 1858  WBC 4.8 0.2* 0.7*  NEUTROABS 4.3 0.0 0.2  HGB 9.3* 8.6* 8.4*  HCT 28.4* 26.3* 25.7*  MCV 87.7 86.2 85.1  PLT 163 71* 68*   Basic Metabolic Panel: Recent Labs  Lab 07/14/18 1301 07/16/18 1054 07/18/18 1858  NA 133* 134* 134*  K 4.3 4.1 4.1  CL 104 104 103  CO2 19* 20* 20*  GLUCOSE 101* 109* 96  BUN 33* 28* 34*  CREATININE 0.94 0.92 1.24*  CALCIUM 8.1* 8.0* 8.2*  MG 2.2 2.2  --   PHOS 3.7  --   --    GFR: Estimated Creatinine Clearance: 43.4 mL/min (A) (by C-G formula based on SCr of 1.24 mg/dL (H)). Liver Function Tests: Recent Labs  Lab 07/14/18 1301 07/16/18 1054 07/18/18 1858  AST 245* 175* 161*  ALT 16 15 15   ALKPHOS 51 39 40  BILITOT 0.6 0.8 0.6  PROT 5.8* 5.8* 5.9*  ALBUMIN 2.6* 2.9* 2.7*   Recent Labs  Lab 07/14/18 1301  AMYLASE 33   No results for input(s): AMMONIA in the last 168 hours. Coagulation Profile: Recent Labs  Lab 07/14/18 1301  INR 0.97   Cardiac Enzymes: No results for input(s): CKTOTAL, CKMB, CKMBINDEX, TROPONINI in the last 168 hours. BNP (last 3 results) No results for input(s): PROBNP in the last 8760 hours. HbA1C: No results for input(s): HGBA1C in the last 72 hours. CBG: No results for input(s): GLUCAP in the last 168 hours. Lipid Profile: No results for input(s): CHOL, HDL, LDLCALC, TRIG, CHOLHDL, LDLDIRECT in the last 72 hours. Thyroid Function Tests: No results for input(s): TSH, T4TOTAL, FREET4, T3FREE, THYROIDAB in the last 72 hours. Anemia Panel: No results for input(s): VITAMINB12, FOLATE, FERRITIN, TIBC, IRON, RETICCTPCT in the last 72 hours. Urine analysis:    Component Value Date/Time   COLORURINE YELLOW 07/18/2018 1859   APPEARANCEUR HAZY (A) 07/18/2018 1859   LABSPEC 1.018 07/18/2018 1859   PHURINE 5.0 07/18/2018 1859   GLUCOSEU NEGATIVE 07/18/2018 1859   HGBUR NEGATIVE 07/18/2018 1859   BILIRUBINUR NEGATIVE 07/18/2018 1859   KETONESUR NEGATIVE 07/18/2018 1859   PROTEINUR  30 (A) 07/18/2018 1859   NITRITE NEGATIVE 07/18/2018 1859   LEUKOCYTESUR NEGATIVE 07/18/2018 1859   Sepsis Labs: @LABRCNTIP (ZOXWRUEAVWUJW:1,XBJYNWGNFAO:1) ) Recent Results (from the past 240 hour(s))  Blood Culture (routine x 2)     Status: None (Preliminary result)   Collection Time: 07/18/18  6:58 PM  Result Value Ref Range Status   Specimen Description LEFT ANTECUBITAL  Final   Special Requests   Final    BOTTLES DRAWN AEROBIC ONLY Blood Culture results may not be optimal due to an excessive volume of blood received in culture bottles Performed at [facility], [address], [place], [place] [address]    Culture PENDING  Incomplete   Report Status PENDING  Incomplete  Blood Culture (routine x 2)     Status: None (Preliminary result)   Collection Time: 07/18/18  7:10 PM  Result Value Ref Range Status   Specimen Description BLOOD RIGHT ARM  Final   Special Requests   Final    BOTTLES DRAWN AEROBIC AND ANAEROBIC Blood Culture results may not be optimal due to an inadequate volume of blood received in culture bottles Performed at [facility], [address] [place], [place] [address]    Culture PENDING  Incomplete   Report Status PENDING  Incomplete     Radiological Exams on Admission: Dg Chest 2 View  Result Date: 07/18/2018 CLINICAL DATA:  Weakness.  Low white cell count. EXAM: CHEST - 2 VIEW COMPARISON:  None. FINDINGS: Injectable port terminates at the expected location of the cavoatrial junction. Cardiomediastinal silhouette is normal. Mediastinal contours appear intact. Low lung volumes. Bilateral pleural effusions with bilateral lower lobe atelectasis versus peribronchial airspace consolidation. Osseous structures are without acute abnormality. Soft tissues are grossly normal. IMPRESSION: Bilateral pleural effusions with bilateral lower lobe atelectasis versus peribronchial airspace consolidation. Electronically Signed   By: Fidela Salisbury M.D.   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 Stoma with possible abscess or infection. EXAM: CT ABDOMEN AND PELVIS WITH CONTRAST TECHNIQUE: Multidetector CT imaging of the abdomen and pelvis was performed using the standard protocol following bolus administration of intravenous contrast. CONTRAST:  124mL ISOVUE-300 IOPAMIDOL (ISOVUE-300) INJECTION 61% COMPARISON:  None. FINDINGS: Lower chest: Linear opacities of the lung bases compatible with minor atelectasis. Small left pleural effusion. Hepatobiliary:  No focal liver abnormality is seen. Dependent increased attenuation within the gallbladder likely representing cholelithiasis. No biliary ductal dilatation. Pancreas: Unremarkable. No pancreatic ductal dilatation or surrounding inflammatory changes. Spleen: Normal in size without focal abnormality. Adrenals/Urinary Tract: No adrenal glands. No focal kidney lesion. Mild bilateral hydronephrosis mass effect on the distal ureters by the pelvic mass. Bladder is not distended and anterior displaced. Stomach/Bowel: No bowel obstruction or inflammatory changes identified. Left lower quadrant colostomy. No discrete rim enhancing collection within the abdominal wall adjacent to the ostomy. There is an 11 mm nodule inferior to the ostomy in the abdominal wall (series 5, image 15) probably representing a metastatic deposit. Vascular: No significant vascular findings. Reproductive: Pelvic structures not visualized due to tumor. Other: Pelvic mass measuring 19 x 15 x 22 cm (AP x ML x CC series 2, image 71 and series 6, image 65) filling the entirety of the pelvis. Extensive peritoneal and mesenteric carcinomatosis with innumerable nodules throughout the mesentery, omentum, and within the peritoneal gutters. Extensive tumor caking of small bowel throughout the mid abdomen. Moderate volume of ascites. Musculoskeletal: No acute or significant  osseous findings. IMPRESSION: 1. Left lower quadrant colostomy. No rim enhancing collection adjacent to the colostomy in the abdominal wall to suggest abscess. 2. Pelvic mass measuring up to 22 cm occupying the entirety of pelvis and extending into lower abdomen. Extensive peritoneal carcinomatosis. Moderate volume of ascites. Small probable metastatic deposit within the abdominal wall adjacent to the colostomy. 3. Mild bilateral hydronephrosis with mass effect on the distal ureters by the pelvic mass. 4. Bilateral lower lobe platelike atelectasis. Small left pleural effusion. 5. Cholelithiasis. Electronically Signed   By: Kristine Garbe M.D.   On: 07/18/2018 20:40    EKG: Independently reviewed.  Sinus tachycardia  Assessment/Plan: Principal Problem:   Abdominal wall cellulitis Active Problems:   MMMT (malignant mixed Mullerian tumor) (HCC)   S/P partial colectomy   Pelvic mass   Nausea and vomiting   Lactic acidosis    This patient was discussed with the ED physician, including pertinent vitals, physical exam findings, labs, and imaging.  We also discussed care given by the ED provider.  1. Abdominal wall cellulitis a. Continue Vanco, Flagyl, cefepime. b. MRSA screening c. Blood cultures pending d. Will give patient neulasta e. Ostomy nurse consult 2. Lactic acidosis a. Status post 2 L bolus b. Recheck lactic acid and follow c. Continue IV fluids at 125 mL's per hour 3. Nausea vomiting a. Add Reglan to home regimen 4. Pelvic mass a. This appears worse than her CT on 8/5.  This is probably causing mass-effect that is probably responsible for much of the patient's discomfort and distention.  Also may explain the nausea and vomiting that the patient is having. 5. Status post partial colectomy a. Ostomy nurse 6. Malignant mixed mullerian tumor  DVT prophylaxis: Lovenox Consultants: None Code Status: DNR Family Communication: Daughter and other family members  present Disposition Plan: Pending improvement   Hava Massingale [place] [facility] Pager [phone]  If 7PM-7AM, please contact night-coverage [email or web address] Password [email or web address]

## 2018-07-18 NOTE — ED Notes (Signed)
CRITICAL VALUE ALERT  Critical Value:  Lactic 2.58  Date & Time Notied:  07/18/18 1916  Provider Notified: EDP Pickering   Orders Received/Actions taken: No orders at this time

## 2018-07-18 NOTE — ED Notes (Signed)
Date and time results received: 07/18/18 8:00 PM     Test: WBC Critical Value: 0.7  Name of Provider Notified: MD Pinkering  Orders Received? Or Actions Taken?: MD notified

## 2018-07-18 NOTE — Progress Notes (Signed)
Pharmacy Antibiotic Note  Krystal Baker is a 62 y.o. female admitted on 07/18/2018 with abscess and possible stomal infection.  She recently completed her fist cycle of chemotherapy on 07/09/18 for malignant mixed Mullerian tumor and is s/p partial colostomy with ostomy creation. Pharmacy has been consulted for cancomycin and cefepime  dosing.  Plan: Give cefepime 2g IV x1 dose, then start cefepime 1g IV q12h Give vancomycin 1g IV x1 dose, then start vancomycin 750mg  IV q12h Vancomycin goal trough range: 15-58mcg/mL Pharmacy will continue to monitor renal function, vancomycin troughs as clinically indicated, cultures and patient progress.   Height: 5' 1.5" (156.2 cm) Weight: 160 lb (72.6 kg) IBW/kg (Calculated) : 48.95  Temp (24hrs), Avg:98.8 F (37.1 C), Min:98.8 F (37.1 C), Max:98.8 F (37.1 C)  Recent Labs  Lab 07/14/18 1301 07/16/18 1054  WBC 4.8 0.2*  CREATININE 0.94 0.92    Estimated Creatinine Clearance: 58.5 mL/min (by C-G formula based on SCr of 0.92 mg/dL).    No Known Allergies  Antimicrobials this admission: 8/17 vancomcyin>>   8/17 cefepime>>   8/17 metronidazole>>  Microbiology results: 8/17 BCx2: ordered   Thank you for allowing pharmacy to be a part of this patient's care.  Despina Pole, Pharm. D. Clinical Pharmacist 07/18/2018 6:56 PM

## 2018-07-18 NOTE — ED Provider Notes (Addendum)
[facility] EMERGENCY DEPARTMENT Provider Note   CSN: [id number] Arrival date & time: 07/18/18  1745     History   Chief Complaint Chief Complaint  Patient presents with  . Abscess    HPI Krystal Baker is a 62 y.o. female.  HPI Patient presents with abdominal pain and possible infection.  She is on chemotherapy for an intra-abdominal sarcoma.  Has been seen at [facility] most recently and received chemotherapy 9 days ago.  Lab work drawn 2 days ago showed a neutropenia.  Now has a left lower quadrant ostomy and has had increasing redness and pain around this.  Has had chills without frank fever.  Has had some nausea vomiting but this predates the abdominal pain.  This is her first round of chemotherapy.  Did get Neulasta at [facility].  Has had abdominal distention that is gotten worse.  Still has had good ostomy output.  Pain is dull.  Increasing redness. Past Medical History:  Diagnosis Date  . Broken shoulder    left  . Sarcoma [facility])     Patient Active Problem List   Diagnosis Date Noted  . Abdominal wall cellulitis 07/18/2018  . S/P partial colectomy 07/18/2018  . Pelvic mass 07/18/2018  . Nausea and vomiting 07/18/2018  . Lactic acidosis 07/18/2018  . 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 Goals of care, counseling/discussion 07/14/2018    Past Surgical History:  Procedure Laterality Date  . BREAST BIOPSY    . CARPAL TUNNEL RELEASE       OB History   None      Home Medications    Prior to Admission medications   Medication Sig Start Date End Date Taking? Authorizing Provider  furosemide (LASIX) 20 MG tablet Take 1 tablet (20 mg total) by mouth daily as needed. 07/14/18  Yes Lockamy, Randi L, NP-C  morphine (MS CONTIN) 15 MG 12 hr tablet Take 15 mg by mouth every 12 (twelve) hours.  07/10/18  Yes [provider]  morphine (MSIR) 15 MG tablet Take by mouth. 07/10/18  Yes [provider]  OLANZapine (ZYPREXA) 5 MG tablet  Take 5 mg by mouth 2 (two) times daily.  07/11/18  Yes [provider]  omeprazole (PRILOSEC) 20 MG capsule Take by mouth.   Yes [provider]  ondansetron (ZOFRAN) 8 MG tablet Take by mouth. 07/08/18  Yes [provider]  prochlorperazine (COMPAZINE) 10 MG tablet Take by mouth. 07/08/18  Yes [provider]  scopolamine (TRANSDERM-SCOP) 1 MG/3DAYS Place 1 patch (1.5 mg total) onto the skin every 3 (three) days. 07/16/18  Yes Derek Jack, MD  senna-docusate (SENOKOT-S) 8.6-50 MG tablet Take by mouth. 07/08/18  Yes [provider]  sodium bicarbonate/sodium chloride SOLN Swish and spit 10 mL every 4 (four) hours while awake.  Dissolve 2 teaspoon in 1 quart of water. 07/08/18  Yes [provider]  sucralfate (CARAFATE) 1 GM/10ML suspension Take by mouth. 07/08/18  Yes [provider]  tamsulosin (FLOMAX) 0.4 MG CAPS capsule Take 0.4 mg by mouth daily.   Yes [provider]  traMADol (ULTRAM) 50 MG tablet Take 50 mg by mouth every 6 (six) hours as needed for moderate pain.  07/06/18  Yes [provider]  polyethylene glycol (MIRALAX / GLYCOLAX) packet Take 17 g by mouth daily as needed.  07/11/18   [provider]    Family History Family History  Problem Relation Age of Onset  . Heart attack Father   .  Leukemia Sister     Social History Social History   Tobacco Use  . Smoking status: Never Smoker  . Smokeless tobacco: Never Used  Substance Use Topics  . Alcohol use: Not Currently  . Drug use: Never     Allergies   Patient has no known allergies.   Review of Systems Review of Systems  Constitutional: Positive for appetite change, chills and fatigue.  HENT: Negative for congestion.   Respiratory: Negative for shortness of breath.   Cardiovascular: Positive for leg swelling.  Gastrointestinal: Positive for abdominal pain, nausea and vomiting.  Endocrine: Negative for polyuria.  Genitourinary:  Negative for flank pain.  Musculoskeletal: Positive for back pain.  Skin: Positive for wound.  Neurological: Positive for weakness.  Psychiatric/Behavioral: Negative for confusion.     Physical Exam Updated Vital Signs BP 119/69   Pulse (!) 115   Temp 98.2 F (36.8 C) (Oral)   Resp (!) 22   Ht 5' 1.5" (1.562 m)   Wt 72.6 kg   SpO2 92%   BMI 29.74 kg/m   Physical Exam  Constitutional: She appears well-developed.  HENT:  Head: Normocephalic.  Neck: Neck supple.  Cardiovascular:  Some tachycardia  Pulmonary/Chest: Effort normal. She has no wheezes. She has no rales.  Port-A-Cath to right chest wall.  Accessed without erythema.  Abdominal: There is tenderness.  Some abdominal distention.  Tenderness worse in left lower quadrant of abdomen.  Midline abdominal wound well healing.  There is erythema around the ostomy in the left lower quadrant progressing medially.  There is tenderness and fullness at the site.  Reviewed picture of the wound that the patient's family has on her phone.  Musculoskeletal: She exhibits edema.  Neurological: She is alert.  Skin: Skin is warm. Capillary refill takes less than 2 seconds.     ED Treatments / Results  Labs (all labs ordered are listed, but only abnormal results are displayed) Labs Reviewed  COMPREHENSIVE METABOLIC PANEL - Abnormal; Notable for the following components:      Result Value   Sodium 134 (*)    CO2 20 (*)    BUN 34 (*)    Creatinine, Ser 1.24 (*)    Calcium 8.2 (*)    Total Protein 5.9 (*)    Albumin 2.7 (*)    AST 161 (*)    GFR calc non Af Amer 46 (*)    GFR calc Af Amer 53 (*)    All other components within normal limits  CBC WITH DIFFERENTIAL/PLATELET - Abnormal; Notable for the following components:   WBC 0.7 (*)    RBC 3.02 (*)    Hemoglobin 8.4 (*)    HCT 25.7 (*)    Platelets 68 (*)    All other components within normal limits  URINALYSIS, ROUTINE W REFLEX MICROSCOPIC - Abnormal; Notable for the  following components:   APPearance HAZY (*)    Protein, ur 30 (*)    Bacteria, UA FEW (*)    All other components within normal limits  I-STAT CG4 LACTIC ACID, ED - Abnormal; Notable for the following components:   Lactic Acid, Venous 2.58 (*)    All other components within normal limits  CULTURE, BLOOD (ROUTINE X 2)  CULTURE, BLOOD (ROUTINE X 2)  MRSA PCR SCREENING  I-STAT CG4 LACTIC ACID, ED    EKG None   Radiology Dg Chest 2 View  Result Date: 07/18/2018 CLINICAL DATA:  Weakness.  Low white cell count. EXAM: CHEST - 2 VIEW  COMPARISON:  None. FINDINGS: Injectable port terminates at the expected location of the cavoatrial junction. Cardiomediastinal silhouette is normal. Mediastinal contours appear intact. Low lung volumes. Bilateral pleural effusions with bilateral lower lobe atelectasis versus peribronchial airspace consolidation. Osseous structures are without acute abnormality. Soft tissues are grossly normal. IMPRESSION: Bilateral pleural effusions with bilateral lower lobe atelectasis versus peribronchial airspace consolidation. Electronically Signed   By: Fidela Salisbury M.D.   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 Stoma with possible abscess or infection. EXAM: CT ABDOMEN AND PELVIS WITH CONTRAST TECHNIQUE: Multidetector CT imaging of the abdomen and pelvis was performed using the standard protocol following bolus administration of intravenous contrast. CONTRAST:  190mL ISOVUE-300 IOPAMIDOL (ISOVUE-300) INJECTION 61% COMPARISON:  None. FINDINGS: Lower chest: Linear opacities of the lung bases compatible with minor atelectasis. Small left pleural effusion. Hepatobiliary: No focal liver abnormality is seen. Dependent increased attenuation within the gallbladder likely representing cholelithiasis. No biliary ductal dilatation. Pancreas: Unremarkable. No pancreatic  ductal dilatation or surrounding inflammatory changes. Spleen: Normal in size without focal abnormality. Adrenals/Urinary Tract: No adrenal glands. No focal kidney lesion. Mild bilateral hydronephrosis mass effect on the distal ureters by the pelvic mass. Bladder is not distended and anterior displaced. Stomach/Bowel: No bowel obstruction or inflammatory changes identified. Left lower quadrant colostomy. No discrete rim enhancing collection within the abdominal wall adjacent to the ostomy. There is an 11 mm nodule inferior to the ostomy in the abdominal wall (series 5, image 15) probably representing a metastatic deposit. Vascular: No significant vascular findings. Reproductive: Pelvic structures not visualized due to tumor. Other: Pelvic mass measuring 19 x 15 x 22 cm (AP x ML x CC series 2, image 71 and series 6, image 65) filling the entirety of the pelvis. Extensive peritoneal and mesenteric carcinomatosis with innumerable nodules throughout the mesentery, omentum, and within the peritoneal gutters. Extensive tumor caking of small bowel throughout the mid abdomen. Moderate volume of ascites. Musculoskeletal: No acute or significant osseous findings. IMPRESSION: 1. Left lower quadrant colostomy. No rim enhancing collection adjacent to the colostomy in the abdominal wall to suggest abscess. 2. Pelvic mass measuring up to 22 cm occupying the entirety of pelvis and extending into lower abdomen. Extensive peritoneal carcinomatosis. Moderate volume of ascites. Small probable metastatic deposit within the abdominal wall adjacent to the colostomy. 3. Mild bilateral hydronephrosis with mass effect on the distal ureters by the pelvic mass. 4. Bilateral lower lobe platelike atelectasis. Small left pleural effusion. 5. Cholelithiasis. Electronically Signed   By: Kristine Garbe M.D.   On: 07/18/2018 20:40    Procedures Procedures (including critical care time)  Medications Ordered in ED Medications    metroNIDAZOLE (FLAGYL) IVPB 500 mg (0 mg Intravenous Stopped 07/18/18 2157)  vancomycin (VANCOCIN) IVPB 1000 mg/200 mL premix (1,000 mg Intravenous New Bag/Given 07/18/18 2226)  ceFEPIme (MAXIPIME) 1 g in sodium chloride 0.9 % 100 mL IVPB (has no administration in time range)  vancomycin (VANCOCIN) IVPB 750 mg/150 ml premix (has no administration in time range)  Tbo-Filgrastim (GRANIX) injection 480 mcg (has no administration in time range)  ceFEPIme (MAXIPIME) 2 g in sodium chloride 0.9 % 100 mL IVPB (0 g Intravenous Stopped 07/18/18 2046)  sodium chloride 0.9 % bolus 1,000 mL (0 mLs Intravenous Stopped 07/18/18 2046)  sodium chloride 0.9 % bolus 1,000 mL (0 mLs Intravenous Stopped 07/18/18 2157)  morphine 4 MG/ML injection 8 mg (8  mg Intravenous Given 07/18/18 1934)  ondansetron (ZOFRAN) injection 4 mg (4 mg Intravenous Given 07/18/18 1933)  iopamidol (ISOVUE-300) 61 % injection 100 mL (100 mLs Intravenous Contrast Given 07/18/18 2009)     Initial Impression / Assessment and Plan / ED Course  I have reviewed the triage vital signs and the nursing notes.  Pertinent labs & imaging results that were available during my care of the patient were reviewed by me and considered in my medical decision making (see chart for details).     Patient presents with abdominal pain.  Known metastatic intra-abdominal cancer.  On chemotherapy and was neutropenic 2 days ago.  No fever but does have increasing abdominal wall erythema.  There is an area of fullness.  CT scan done does not show abscess.  Urine does not show infection although there is mild bilateral hydronephrosis from the tumor.  Lactic acid mildly elevated.  However does have an absolute neutrophil count of 200.  With the neutropenia broad-spectrum antibiotics have been covered.  Fluid bolus had been given.  Still some tachycardia.  Patient is worried about fluid due to her extra fluid on her legs and in her abdomen.  She was informed of the need for  fluid with the infection at this time.  Will admit to hospitalist.  CRITICAL CARE Performed by: Davonna Belling Total critical care time: 30 minutes Critical care time was exclusive of separately billable procedures and treating other patients. Critical care was necessary to treat or prevent imminent or life-threatening deterioration. Critical care was time spent personally by me on the following activities: development of treatment plan with patient and/or surrogate as well as nursing, discussions with consultants, evaluation of patient's response to treatment, examination of patient, obtaining history from patient or surrogate, ordering and performing treatments and interventions, ordering and review of laboratory studies, ordering and review of radiographic studies, pulse oximetry and re-evaluation of patient's condition.  Final Clinical Impressions(s) / ED Diagnoses   Final diagnoses:  Cellulitis of abdominal wall  Chemotherapy-induced neutropenia ([place])  AKI (acute kidney injury) ([place])  Metastatic cancer [facility])    ED Discharge Orders    None       Davonna Belling, MD 07/18/18 2106    Davonna Belling, MD 07/18/18 2315

## 2018-07-18 NOTE — ED Notes (Signed)
Reevaluate patient's temperature. Pt states she drank a little water around 10 minutes ago

## 2018-07-18 NOTE — ED Notes (Signed)
Gave EKG to Dr.Pickering 

## 2018-07-19 ENCOUNTER — Inpatient Hospital Stay (HOSPITAL_COMMUNITY): Payer: BLUE CROSS/BLUE SHIELD

## 2018-07-19 DIAGNOSIS — I82411 Acute embolism and thrombosis of right femoral vein: Secondary | ICD-10-CM | POA: Diagnosis present

## 2018-07-19 LAB — CBC
HEMATOCRIT: 24.1 % — AB (ref 36.0–46.0)
HEMOGLOBIN: 7.8 g/dL — AB (ref 12.0–15.0)
MCH: 28.1 pg (ref 26.0–34.0)
MCHC: 32.4 g/dL (ref 30.0–36.0)
MCV: 86.7 fL (ref 78.0–100.0)
Platelets: 74 10*3/uL — ABNORMAL LOW (ref 150–400)
RBC: 2.78 MIL/uL — AB (ref 3.87–5.11)
RDW: 15 % (ref 11.5–15.5)
WBC: 1.3 10*3/uL — CL (ref 4.0–10.5)

## 2018-07-19 LAB — BASIC METABOLIC PANEL
ANION GAP: 11 (ref 5–15)
BUN: 32 mg/dL — ABNORMAL HIGH (ref 8–23)
CHLORIDE: 109 mmol/L (ref 98–111)
CO2: 17 mmol/L — AB (ref 22–32)
Calcium: 7.8 mg/dL — ABNORMAL LOW (ref 8.9–10.3)
Creatinine, Ser: 1.11 mg/dL — ABNORMAL HIGH (ref 0.44–1.00)
GFR calc non Af Amer: 52 mL/min — ABNORMAL LOW (ref >60)
Glucose, Bld: 77 mg/dL (ref 70–99)
POTASSIUM: 4.1 mmol/L (ref 3.5–5.1)
Sodium: 137 mmol/L (ref 135–145)

## 2018-07-19 LAB — MRSA PCR SCREENING: MRSA BY PCR: NEGATIVE

## 2018-07-19 LAB — PREPARE RBC (CROSSMATCH)

## 2018-07-19 LAB — ABO/RH: ABO/RH(D): O POS

## 2018-07-19 MED ORDER — SCOPOLAMINE 1 MG/3DAYS TD PT72
1.0000 | MEDICATED_PATCH | TRANSDERMAL | Status: DC
  Administered 2018-07-20: 1.5 mg via TRANSDERMAL
  Filled 2018-07-19: qty 1

## 2018-07-19 MED ORDER — FUROSEMIDE 10 MG/ML IJ SOLN
40.0000 mg | Freq: Two times a day (BID) | INTRAMUSCULAR | Status: AC
  Administered 2018-07-19 – 2018-07-20 (×3): 40 mg via INTRAVENOUS
  Filled 2018-07-19 (×3): qty 4

## 2018-07-19 MED ORDER — CHLORHEXIDINE GLUCONATE 0.12 % MT SOLN
15.0000 mL | Freq: Two times a day (BID) | OROMUCOSAL | Status: DC
  Administered 2018-07-19 – 2018-07-21 (×5): 15 mL via OROMUCOSAL
  Filled 2018-07-19 (×5): qty 15

## 2018-07-19 MED ORDER — ORAL CARE MOUTH RINSE
15.0000 mL | Freq: Two times a day (BID) | OROMUCOSAL | Status: DC
  Administered 2018-07-19 – 2018-07-20 (×3): 15 mL via OROMUCOSAL

## 2018-07-19 MED ORDER — APIXABAN 5 MG PO TABS
10.0000 mg | ORAL_TABLET | Freq: Two times a day (BID) | ORAL | Status: DC
  Administered 2018-07-19 – 2018-07-21 (×4): 10 mg via ORAL
  Filled 2018-07-19: qty 4
  Filled 2018-07-19 (×4): qty 2

## 2018-07-19 MED ORDER — TBO-FILGRASTIM 480 MCG/0.8ML ~~LOC~~ SOSY
480.0000 ug | PREFILLED_SYRINGE | Freq: Once | SUBCUTANEOUS | Status: DC
  Filled 2018-07-19: qty 0.8

## 2018-07-19 MED ORDER — SODIUM CHLORIDE 0.9 % IV SOLN: 1.0000 g | INTRAVENOUS | Status: DC

## 2018-07-19 MED ORDER — SODIUM CHLORIDE 0.9 % IV SOLN
1.0000 g | INTRAVENOUS | Status: DC
  Administered 2018-07-19 – 2018-07-20 (×2): 1 g via INTRAVENOUS
  Filled 2018-07-19: qty 10
  Filled 2018-07-19 (×2): qty 1

## 2018-07-19 MED ORDER — POTASSIUM CHLORIDE CRYS ER 20 MEQ PO TBCR
40.0000 meq | EXTENDED_RELEASE_TABLET | Freq: Two times a day (BID) | ORAL | Status: AC
  Administered 2018-07-19 (×2): 40 meq via ORAL
  Filled 2018-07-19 (×2): qty 2

## 2018-07-19 MED ORDER — SODIUM CHLORIDE 0.9% IV SOLUTION: Freq: Once | INTRAVENOUS | Status: DC

## 2018-07-19 MED ORDER — APIXABAN 5 MG PO TABS: 5.0000 mg | ORAL_TABLET | Freq: Two times a day (BID) | ORAL | Status: DC

## 2018-07-19 NOTE — Progress Notes (Signed)
ANTICOAGULATION CONSULT NOTE - Initial Consult  Pharmacy Consult for apixaban dosing Indication:   New- onset DVT of RLE  No Known Allergies  Patient Measurements: Height: 5' 1.5" (156.2 cm) Weight: 162 lb 14.7 oz (73.9 kg) IBW/kg (Calculated) : 48.95 Heparin Dosing Weight: n/a  Vital Signs: Temp: 97.3 F (36.3 C) (08/18 0529) Temp Source: Oral (08/18 0529) BP: 119/64 (08/18 0529) Pulse Rate: 115 (08/18 0529)  Labs: Recent Labs    07/18/18 1858 07/19/18 0641  HGB 8.4* 7.8*  HCT 25.7* 24.1*  PLT 68* 74*  CREATININE 1.24* 1.11*    Estimated Creatinine Clearance: 48.9 mL/min (A) (by C-G formula based on SCr of 1.11 mg/dL (H)).   Medical History: Past Medical History:  Diagnosis Date  . Broken shoulder    left  . Sarcoma ([place])     Medications:  Scheduled:  . apixaban  10 mg Oral BID  . [START ON 07/26/2018] apixaban  5 mg Oral BID  . chlorhexidine  15 mL Mouth Rinse BID  . furosemide  40 mg Intravenous Q12H  . mouth rinse  15 mL Mouth Rinse q12n4p  . metoCLOPramide (REGLAN) injection  10 mg Intravenous Q6H  . morphine  15 mg Oral Q12H  . OLANZapine  5 mg Oral BID  . pneumococcal 23 valent vaccine  0.5 mL Intramuscular Tomorrow-1000  . potassium chloride  40 mEq Oral BID  . prochlorperazine  10 mg Oral Q6H  . [START ON 07/20/2018] scopolamine  1 patch Transdermal Q72H  . tamsulosin  0.4 mg Oral Daily  . [START ON 07/20/2018] Tbo-Filgrastim  480 mcg Subcutaneous Once    Assessment: Pharmacy consulted to dose apixaban for new onse-DVT in this 62 yo female cancer patient. Patient is anemic d/t recent chemotherapy, so caution will be needed in anti-coagulation.    Plan:  Start apixaban 10mg  bid for 7 days, then start apixaban 5mg  bid Monitor patient carefully for s/s of bleeding, considering patient's anemia.  Thank you for allowing pharmacy to be a part of this patient's care.   Despina Pole 07/19/2018,2:39 PM

## 2018-07-19 NOTE — Progress Notes (Signed)
CRITICAL VALUE ALERT  Critical Value:  WBC 1.3  Date & Time Notied:  07/19/2018 0739  Provider Notified: Wynetta Emery  Orders Received/Actions taken: No orders given at this time. Will continue to monitor.

## 2018-07-19 NOTE — Progress Notes (Signed)
Pt with ongoing tachypnea with pt requesting oxygen due to SOB with OOB. Pt states she thinks she has too much fluid on board and noted with ongoing +2 edema BLE.  Pt 02 Sat 95%.  Dr Wynetta Emery paged and informed pt placed on Oxygen 2L/M.

## 2018-07-19 NOTE — Progress Notes (Signed)
PROGRESS NOTE  Krystal Baker  ZLD:[id number]  DOB: 05/19/1956  DOA: 07/18/2018 PCP: Eber Hong, MD  Brief Admission Hx: Krystal Baker is a 62 y.o. 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  Pt was admitted for a moderate skin infection around her stoma.    MDM/Assessment & Plan:    1. Cellulitis of abdominal wall - Redness and tenderness is improving on the triple antibiotics.  Her MRSA screen was negative.  Will de-escalate antibiotics and follow cellulitis order set bundle.  Start ceftriaxone 1 gm daily.  Follow clinically.   2. Lactic acidosis - resolved after IV fluids and boluses.   3. Nausea and emesis - multifactorial given fast growing pelvic mass.   4. 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 She is on chemotherapy and scheduled for more treatment on 8/29. Given these new findings would consult her oncologist Dr. Lamonte Richer in AM for recommendations.  I think we should at least consider getting palliative medicine involved as this tumor is very aggressive and does not seem to be responding to the chemotherapy.  Will await for recommendations from oncologist.  5. S/p  Partial colectomy - Wound ostomy nurse consulted to evaluate ostomy tomorrow.  6. Pancytopenia - with low platelets will discontinue lovenox.  SCDs for DVT prophylaxis.  7. Peripheral edema - large increase in edema in legs in feet since boluses and IV fluids.  Will hold IV fluids.  Place Ted Hoses and give IV lasix x 3 doses.  Check [place] to evaluate for DVT.   DVT prophylaxis: SCDs Code Status: DNR Family Communication: daughter at bedside  Disposition Plan: not medically ready for discharge, needs IV antibiotics  Consultants:  Oncology  WOC  RN  Antimicrobials:  Vancomycin 8/17-8/18  Metronidazole 8/17-8/18  Cefepime 8/17-8/18  Ceftriaxone 8/18 -   Subjective: Pt says the tenderness and redness of skin infection is better.  She says she is having much more swelling in feet and legs.   Objective: Vitals:   07/18/18 2230 07/18/18 2257 07/18/18 2321 07/19/18 0529  BP: 119/69  108/63 119/64  Pulse: (!) 115  (!) 115 (!) 115  Resp: (!) 22  16 16   Temp:  98.2 F (36.8 C) 97.8 F (36.6 C) (!) 97.3 F (36.3 C)  TempSrc:  Oral Oral Oral  SpO2: 92%  95% 94%  Weight:   73.9 kg   Height:        Intake/Output Summary (Last 24 hours) at 07/19/2018 0946 Last data filed at 07/19/2018 9030 Gross per 24 hour  Intake 3765 ml  Output 300 ml  Net 3465 ml   Filed Weights   07/18/18 1755 07/18/18 2321  Weight: 72.6 kg 73.9 kg   REVIEW OF SYSTEMS  As per history otherwise all reviewed and reported negative  Exam:  General exam: awake, alert, NAD, cooperative.  Respiratory system: shallow but clear. No increased work of breathing. Cardiovascular system: S1 & S2 heard, tachycardic. No JVD, murmurs, gallops, clicks or pedal edema. Gastrointestinal system: Abdomen is nondistended, soft and nontender. Normal bowel sounds heard. Central nervous system: Alert and oriented. No focal neurological deficits. Extremities: 2+ edema bilateral lower extremities.  Data Reviewed: Basic Metabolic Panel: Recent Labs  Lab 07/14/18 1301 07/16/18 1054 07/18/18 1858 07/19/18 0641  NA 133* 134* 134*  137  K 4.3 4.1 4.1 4.1  CL 104 104 103 109  CO2 19* 20* 20* 17*  GLUCOSE 101* 109* 96 77  BUN 33* 28* 34* 32*  CREATININE 0.94 0.92 1.24* 1.11*  CALCIUM 8.1* 8.0* 8.2* 7.8*  MG 2.2 2.2  --   --   PHOS 3.7  --   --   --    Liver Function Tests: Recent Labs  Lab 07/14/18 1301 07/16/18 1054 07/18/18 1858  AST 245* 175* 161*  ALT 16 15 15   ALKPHOS 51 39 40  BILITOT 0.6 0.8 0.6  PROT 5.8* 5.8* 5.9*  ALBUMIN 2.6* 2.9* 2.7*    Recent Labs  Lab 07/14/18 1301  AMYLASE 33   No results for input(s): AMMONIA in the last 168 hours. CBC: Recent Labs  Lab 07/14/18 1301 07/16/18 1054 07/18/18 1858 07/19/18 0641  WBC 4.8 0.2* 0.7* 1.3*  NEUTROABS 4.3 0.0 0.2  --   HGB 9.3* 8.6* 8.4* 7.8*  HCT 28.4* 26.3* 25.7* 24.1*  MCV 87.7 86.2 85.1 86.7  PLT 163 71* 68* 74*   Cardiac Enzymes: No results for input(s): CKTOTAL, CKMB, CKMBINDEX, TROPONINI in the last 168 hours. CBG (last 3)  No results for input(s): GLUCAP in the last 72 hours. Recent Results (from the past 240 hour(s))  Blood Culture (routine x 2)     Status: None (Preliminary result)   Collection Time: 07/18/18  6:58 PM  Result Value Ref Range Status   Specimen Description LEFT ANTECUBITAL  Final   Special Requests   Final    BOTTLES DRAWN AEROBIC ONLY Blood Culture results may not be optimal due to an excessive volume of blood received in culture bottles   Culture   Final    NO GROWTH < 12 HOURS Performed at [facility], [address], [place], [place] [address]    Report Status PENDING  Incomplete  Blood Culture (routine x 2)     Status: None (Preliminary result)   Collection Time: 07/18/18  7:10 PM  Result Value Ref Range Status   Specimen Description BLOOD RIGHT ARM  Final   Special Requests   Final    BOTTLES DRAWN AEROBIC AND ANAEROBIC Blood Culture results may not be optimal due to an inadequate volume of blood received in culture bottles   Culture   Final    NO GROWTH < 12 HOURS Performed at [facility], [address] [place], [place] [address]    Report Status PENDING  Incomplete  MRSA PCR Screening     Status: None   Collection Time: 07/19/18  5:41 AM  Result Value Ref Range Status   MRSA by PCR NEGATIVE NEGATIVE Final    Comment:        The GeneXpert MRSA Assay (FDA approved for NASAL specimens only), is one component of a comprehensive MRSA colonization surveillance program. It is not intended to diagnose MRSA infection  nor to guide or monitor treatment for MRSA infections. Performed at [facility], [address] [place], [place] [address]     Studies: Dg Chest 2 View  Result Date: 07/18/2018 CLINICAL DATA:  Weakness.  Low white cell count. EXAM: CHEST - 2 VIEW COMPARISON:  None. FINDINGS: Injectable port terminates at the expected location of the cavoatrial junction. Cardiomediastinal silhouette is normal. Mediastinal contours appear intact. Low lung volumes. Bilateral pleural effusions with bilateral lower lobe atelectasis versus peribronchial airspace consolidation. Osseous structures are without acute abnormality. Soft tissues are grossly normal. IMPRESSION: Bilateral pleural effusions with  bilateral lower lobe atelectasis versus peribronchial airspace consolidation. Electronically Signed   By: Fidela Salisbury M.D.   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 Stoma with possible abscess or infection. EXAM: CT ABDOMEN AND PELVIS WITH CONTRAST TECHNIQUE: Multidetector CT imaging of the abdomen and pelvis was performed using the standard protocol following bolus administration of intravenous contrast. CONTRAST:  161mL ISOVUE-300 IOPAMIDOL (ISOVUE-300) INJECTION 61% COMPARISON:  None. FINDINGS: Lower chest: Linear opacities of the lung bases compatible with minor atelectasis. Small left pleural effusion. Hepatobiliary: No focal liver abnormality is seen. Dependent increased attenuation within the gallbladder likely representing cholelithiasis. No biliary ductal dilatation. Pancreas: Unremarkable. No pancreatic ductal dilatation or surrounding inflammatory changes. Spleen: Normal in size without focal abnormality. Adrenals/Urinary Tract: No adrenal glands. No focal kidney lesion. Mild bilateral hydronephrosis mass effect on the distal ureters by the pelvic mass. Bladder is not distended and anterior  displaced. Stomach/Bowel: No bowel obstruction or inflammatory changes identified. Left lower quadrant colostomy. No discrete rim enhancing collection within the abdominal wall adjacent to the ostomy. There is an 11 mm nodule inferior to the ostomy in the abdominal wall (series 5, image 15) probably representing a metastatic deposit. Vascular: No significant vascular findings. Reproductive: Pelvic structures not visualized due to tumor. Other: Pelvic mass measuring 19 x 15 x 22 cm (AP x ML x CC series 2, image 71 and series 6, image 65) filling the entirety of the pelvis. Extensive peritoneal and mesenteric carcinomatosis with innumerable nodules throughout the mesentery, omentum, and within the peritoneal gutters. Extensive tumor caking of small bowel throughout the mid abdomen. Moderate volume of ascites. Musculoskeletal: No acute or significant osseous findings. IMPRESSION: 1. Left lower quadrant colostomy. No rim enhancing collection adjacent to the colostomy in the abdominal wall to suggest abscess. 2. Pelvic mass measuring up to 22 cm occupying the entirety of pelvis and extending into lower abdomen. Extensive peritoneal carcinomatosis. Moderate volume of ascites. Small probable metastatic deposit within the abdominal wall adjacent to the colostomy. 3. Mild bilateral hydronephrosis with mass effect on the distal ureters by the pelvic mass. 4. Bilateral lower lobe platelike atelectasis. Small left pleural effusion. 5. Cholelithiasis. Electronically Signed   By: Kristine Garbe M.D.   On: 07/18/2018 20:40   Scheduled Meds: . chlorhexidine  15 mL Mouth Rinse BID  . furosemide  40 mg Intravenous Q12H  . mouth rinse  15 mL Mouth Rinse q12n4p  . metoCLOPramide (REGLAN) injection  10 mg Intravenous Q6H  . morphine  15 mg Oral Q12H  . OLANZapine  5 mg Oral BID  . pneumococcal 23 valent vaccine  0.5 mL Intramuscular Tomorrow-1000  . potassium chloride  40 mEq Oral BID  . prochlorperazine  10 mg Oral  Q6H  . [START ON 07/20/2018] scopolamine  1 patch Transdermal Q72H  . tamsulosin  0.4 mg Oral Daily  . [START ON 07/20/2018] Tbo-Filgrastim  480 mcg Subcutaneous Once   Continuous Infusions: . sodium chloride 10 mL/hr at 07/19/18 0939  . cefTRIAXone (ROCEPHIN)  IV    . famotidine (PEPCID) IV Stopped (07/19/18 0131)   Principal Problem:   Abdominal wall cellulitis Active Problems:   MMMT (malignant mixed Mullerian tumor) (HCC)   S/P partial colectomy   Pelvic mass   Nausea and vomiting   Lactic acidosis  Time spent:   Irwin Brakeman, MD, FAAFP [facility] Pager [phone]  If 7PM-7AM, please  contact night-coverage [email or web address] Password [email or web address] 07/19/2018, 9:46 AM    LOS: 1 day

## 2018-07-19 NOTE — Progress Notes (Addendum)
07/19/2018 2:31 PM  [place] results of LEs reviewed. DVT RLE.  I spoke with pharmacist about patient's low platelets.  Recommend apixaban.  Will DC SCD and place TED hoses.  Updated patient at bedside.  Family requesting blood transfusion. Hg 7.8 today.  Family said hematologist said that he would transfuse for Hg less than 8.  Will type and cross and transfuse 1 unit PRBC.  Will ask for heme/onc consult for tomorrow.    Murvin Natal MD

## 2018-07-19 NOTE — Progress Notes (Signed)
Patient resting in bed and daughter at bedside. Patient states that she feel short of breath when she is ambulating but feels okay with resting in bed. Patient O2 Sats are between 92-98% on room air. Night shift nurse has paged MD about issue. Will continue to monitor.

## 2018-07-20 ENCOUNTER — Other Ambulatory Visit (HOSPITAL_COMMUNITY): Payer: BLUE CROSS/BLUE SHIELD

## 2018-07-20 DIAGNOSIS — D701 Agranulocytosis secondary to cancer chemotherapy: Secondary | ICD-10-CM

## 2018-07-20 DIAGNOSIS — T451X5A Adverse effect of antineoplastic and immunosuppressive drugs, initial encounter: Secondary | ICD-10-CM

## 2018-07-20 DIAGNOSIS — C549 Malignant neoplasm of corpus uteri, unspecified: Secondary | ICD-10-CM

## 2018-07-20 DIAGNOSIS — R601 Generalized edema: Secondary | ICD-10-CM

## 2018-07-20 DIAGNOSIS — Z806 Family history of leukemia: Secondary | ICD-10-CM

## 2018-07-20 DIAGNOSIS — I824Y1 Acute embolism and thrombosis of unspecified deep veins of right proximal lower extremity: Secondary | ICD-10-CM

## 2018-07-20 DIAGNOSIS — I82411 Acute embolism and thrombosis of right femoral vein: Secondary | ICD-10-CM

## 2018-07-20 DIAGNOSIS — R600 Localized edema: Secondary | ICD-10-CM

## 2018-07-20 DIAGNOSIS — L03311 Cellulitis of abdominal wall: Secondary | ICD-10-CM

## 2018-07-20 DIAGNOSIS — C499 Malignant neoplasm of connective and soft tissue, unspecified: Secondary | ICD-10-CM

## 2018-07-20 DIAGNOSIS — N179 Acute kidney failure, unspecified: Secondary | ICD-10-CM

## 2018-07-20 LAB — CBC
HCT: 30.8 % — ABNORMAL LOW (ref 36.0–46.0)
Hemoglobin: 10 g/dL — ABNORMAL LOW (ref 12.0–15.0)
MCH: 28.2 pg (ref 26.0–34.0)
MCHC: 32.5 g/dL (ref 30.0–36.0)
MCV: 86.8 fL (ref 78.0–100.0)
PLATELETS: 165 10*3/uL (ref 150–400)
RBC: 3.55 MIL/uL — ABNORMAL LOW (ref 3.87–5.11)
RDW: 15.1 % (ref 11.5–15.5)
WBC: 7.1 10*3/uL (ref 4.0–10.5)

## 2018-07-20 LAB — TYPE AND SCREEN
ABO/RH(D): O POS
ANTIBODY SCREEN: NEGATIVE
Unit division: 0

## 2018-07-20 LAB — BASIC METABOLIC PANEL
Anion gap: 13 (ref 5–15)
BUN: 34 mg/dL — AB (ref 8–23)
CHLORIDE: 107 mmol/L (ref 98–111)
CO2: 18 mmol/L — AB (ref 22–32)
CREATININE: 1.25 mg/dL — AB (ref 0.44–1.00)
Calcium: 8.4 mg/dL — ABNORMAL LOW (ref 8.9–10.3)
GFR calc Af Amer: 52 mL/min — ABNORMAL LOW (ref >60)
GFR calc non Af Amer: 45 mL/min — ABNORMAL LOW (ref >60)
GLUCOSE: 85 mg/dL (ref 70–99)
Potassium: 4.3 mmol/L (ref 3.5–5.1)
SODIUM: 138 mmol/L (ref 135–145)

## 2018-07-20 LAB — BPAM RBC
Blood Product Expiration Date: 201909102359
ISSUE DATE / TIME: 201908182125
Unit Type and Rh: 5100

## 2018-07-20 MED ORDER — FILGRASTIM 480 MCG/1.6ML IJ SOLN
480.0000 ug | Freq: Once | INTRAMUSCULAR | Status: AC
  Administered 2018-07-20: 480 ug via SUBCUTANEOUS
  Filled 2018-07-20: qty 1.6

## 2018-07-20 MED ORDER — FAMOTIDINE IN NACL 20-0.9 MG/50ML-% IV SOLN
20.0000 mg | Freq: Every day | INTRAVENOUS | Status: DC
  Administered 2018-07-20: 20 mg via INTRAVENOUS
  Filled 2018-07-20 (×2): qty 50

## 2018-07-20 MED ORDER — FUROSEMIDE 10 MG/ML IJ SOLN
40.0000 mg | Freq: Two times a day (BID) | INTRAMUSCULAR | Status: DC
  Administered 2018-07-20 – 2018-07-21 (×2): 40 mg via INTRAVENOUS
  Filled 2018-07-20 (×2): qty 4

## 2018-07-20 MED ORDER — ALBUMIN HUMAN 25 % IV SOLN
25.0000 g | Freq: Two times a day (BID) | INTRAVENOUS | Status: DC
  Administered 2018-07-20 – 2018-07-21 (×2): 25 g via INTRAVENOUS
  Filled 2018-07-20 (×4): qty 50

## 2018-07-20 NOTE — Progress Notes (Signed)
PROGRESS NOTE  Krystal Baker  SPQ:[id number]  DOB: 11/25/1956  DOA: 07/18/2018 PCP: Eber Hong, MD  Brief Admission Hx: Krystal Baker is a 62 y.o. 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  Pt was admitted for a moderate skin infection around her stoma.    MDM/Assessment & Plan:    1. Cellulitis of abdominal wall -currently on intravenous antibiotics and appears to be clinically improving.  Still has some erythema.  Would benefit from another day of IV antibiotics. 2. Lactic acidosis - resolved after IV fluids and boluses.   3. Nausea and emesis - multifactorial given fast growing pelvic mass.  Continue antiemetics. 4. 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 She is on chemotherapy and scheduled for more treatment on 8/29.  Seen by oncology.  Appreciate input.  Plan is for another treatment at the end of this month as previously scheduled. 5. S/p  Partial colectomy -seen by wound ostomy nurse. 6. Pancytopenia likely related to chemotherapy.  Received a dose of filgastrim as well as transfusion of PRBC.  Also lines have improved. 7. 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 8. Peripheral edema.  Likely has some element of anasarca related to hypoalbuminemia.  Started on IV Lasix and albumin infusion.  DVT prophylaxis: SCDs Code Status: DNR Family Communication: daughter at bedside  Disposition Plan: not medically ready for discharge, needs IV antibiotics  Consultants:  Oncology  WOC RN  Antimicrobials:  Vancomycin 8/17-8/18  Metronidazole 8/17-8/18  Cefepime 8/17-8/18  Ceftriaxone 8/18 -   Subjective: Patient has been sleepy today.  P.o. intake has been poor.  Still has  nausea.  Objective: Vitals:   07/19/18 2120 07/19/18 2150 07/20/18 0100 07/20/18 0554  BP: 115/64 109/68 109/70 118/68  Pulse: (!) 115 (!) 115 (!) 109 (!) 111  Resp: 14   18  Temp: 97.8 F (36.6 C) 97.7 F (36.5 C) (!) 97.3 F (36.3 C) (!) 97.3 F (36.3 C)  TempSrc: Oral Oral Oral Oral  SpO2: 94% 97% 94% 91%  Weight:      Height:        Intake/Output Summary (Last 24 hours) at 07/20/2018 1857 Last data filed at 07/20/2018 0600 Gross per 24 hour  Intake 1507.25 ml  Output 300 ml  Net 1207.25 ml   Filed Weights   07/18/18 1755 07/18/18 2321  Weight: 72.6 kg 73.9 kg   REVIEW OF SYSTEMS  As per history otherwise all reviewed and reported negative  Exam:  General exam: Alert, awake, oriented x 3 Respiratory system: Clear to auscultation. Respiratory effort normal. Cardiovascular system:RRR. No murmurs, rubs, gallops. Gastrointestinal system: Abdomen is distended, firm, ostomy in left lower quadrant normal bowel sounds heard. Central nervous system: Alert and oriented. No focal neurological deficits. Extremities: 1+ bilateral lower extremity pitting edema Skin: Skin over abdominal wall does show some erythema, but improving Psychiatry: Judgement and insight appear normal. Mood & affect appropriate.  .  Data Reviewed: Basic Metabolic Panel: Recent Labs  Lab 07/14/18 1301 07/16/18 1054 07/18/18 1858 07/19/18 0641 07/20/18 0624  NA 133* 134* 134* 137 138  K 4.3 4.1 4.1 4.1 4.3  CL 104 104 103 109 107  CO2 19* 20* 20* 17* 18*  GLUCOSE 101* 109* 96 77 85  BUN 33* 28* 34* 32* 34*  CREATININE 0.94 0.92 1.24* 1.11* 1.25*  CALCIUM 8.1* 8.0* 8.2* 7.8* 8.4*  MG 2.2 2.2  --   --   --   PHOS 3.7  --   --   --   --    Liver Function Tests: Recent Labs  Lab 07/14/18 1301 07/16/18 1054 07/18/18 1858  AST 245* 175* 161*  ALT 16 15 15   ALKPHOS 51 39 40  BILITOT 0.6 0.8 0.6  PROT 5.8* 5.8* 5.9*  ALBUMIN 2.6* 2.9* 2.7*   Recent Labs  Lab 07/14/18 1301  AMYLASE 33    No results for input(s): AMMONIA in the last 168 hours. CBC: Recent Labs  Lab 07/14/18 1301 07/16/18 1054 07/18/18 1858 07/19/18 0641 07/20/18 0624  WBC 4.8 0.2* 0.7* 1.3* 7.1  NEUTROABS 4.3 0.0 0.2  --   --   HGB 9.3* 8.6* 8.4* 7.8* 10.0*  HCT 28.4* 26.3* 25.7* 24.1* 30.8*  MCV 87.7 86.2 85.1 86.7 86.8  PLT 163 71* 68* 74* 165   Cardiac Enzymes: No results for input(s): CKTOTAL, CKMB, CKMBINDEX, TROPONINI in the last 168 hours. CBG (last 3)  No results for input(s): GLUCAP in the last 72 hours. Recent Results (from the past 240 hour(s))  Blood Culture (routine x 2)     Status: None (Preliminary result)   Collection Time: 07/18/18  6:58 PM  Result Value Ref Range Status   Specimen Description LEFT ANTECUBITAL  Final   Special Requests   Final    BOTTLES DRAWN AEROBIC ONLY Blood Culture results may not be optimal due to an excessive volume of blood received in culture bottles   Culture   Final    NO GROWTH 2 DAYS Performed at [facility], [address], [place], [place] [address]    Report Status PENDING  Incomplete  Blood Culture (routine x 2)     Status: None (Preliminary result)   Collection Time: 07/18/18  7:10 PM  Result Value Ref Range Status   Specimen Description BLOOD RIGHT ARM  Final   Special Requests   Final    BOTTLES DRAWN AEROBIC AND ANAEROBIC Blood Culture results may not be optimal due to an inadequate volume of blood received in culture bottles   Culture   Final    NO GROWTH 2 DAYS Performed at [facility], [address], [place], [place] [address]    Report Status PENDING  Incomplete  MRSA PCR Screening     Status: None   Collection Time: 07/19/18  5:41 AM  Result Value Ref Range Status   MRSA by PCR NEGATIVE NEGATIVE Final    Comment:        The GeneXpert MRSA Assay (FDA approved for NASAL specimens only), is one component of a comprehensive MRSA colonization surveillance program. It is not intended to diagnose MRSA infection nor to guide  or monitor treatment for MRSA infections. Performed at [facility], [address], [place], [place] [address]     Studies: Dg Chest 2 View  Result Date: 07/18/2018 CLINICAL DATA:  Weakness.  Low white cell count. EXAM: CHEST - 2 VIEW COMPARISON:  None. FINDINGS: Injectable port terminates at the expected location of the cavoatrial junction. Cardiomediastinal silhouette is normal. Mediastinal contours appear intact. Low lung volumes. Bilateral pleural effusions with bilateral lower lobe atelectasis versus peribronchial airspace consolidation. Osseous structures are without acute abnormality. Soft tissues are grossly normal. IMPRESSION: Bilateral pleural effusions with bilateral lower lobe atelectasis versus peribronchial airspace consolidation. Electronically  Signed   By: Fidela Salisbury M.D.   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 Stoma with possible abscess or infection. EXAM: CT ABDOMEN AND PELVIS WITH CONTRAST TECHNIQUE: Multidetector CT imaging of the abdomen and pelvis was performed using the standard protocol following bolus administration of intravenous contrast. CONTRAST:  127mL ISOVUE-300 IOPAMIDOL (ISOVUE-300) INJECTION 61% COMPARISON:  None. FINDINGS: Lower chest: Linear opacities of the lung bases compatible with minor atelectasis. Small left pleural effusion. Hepatobiliary: No focal liver abnormality is seen. Dependent increased attenuation within the gallbladder likely representing cholelithiasis. No biliary ductal dilatation. Pancreas: Unremarkable. No pancreatic ductal dilatation or surrounding inflammatory changes. Spleen: Normal in size without focal abnormality. Adrenals/Urinary Tract: No adrenal glands. No focal kidney lesion. Mild bilateral hydronephrosis mass effect on the distal ureters by the pelvic mass. Bladder is not distended and anterior displaced.  Stomach/Bowel: No bowel obstruction or inflammatory changes identified. Left lower quadrant colostomy. No discrete rim enhancing collection within the abdominal wall adjacent to the ostomy. There is an 11 mm nodule inferior to the ostomy in the abdominal wall (series 5, image 15) probably representing a metastatic deposit. Vascular: No significant vascular findings. Reproductive: Pelvic structures not visualized due to tumor. Other: Pelvic mass measuring 19 x 15 x 22 cm (AP x ML x CC series 2, image 71 and series 6, image 65) filling the entirety of the pelvis. Extensive peritoneal and mesenteric carcinomatosis with innumerable nodules throughout the mesentery, omentum, and within the peritoneal gutters. Extensive tumor caking of small bowel throughout the mid abdomen. Moderate volume of ascites. Musculoskeletal: No acute or significant osseous findings. IMPRESSION: 1. Left lower quadrant colostomy. No rim enhancing collection adjacent to the colostomy in the abdominal wall to suggest abscess. 2. Pelvic mass measuring up to 22 cm occupying the entirety of pelvis and extending into lower abdomen. Extensive peritoneal carcinomatosis. Moderate volume of ascites. Small probable metastatic deposit within the abdominal wall adjacent to the colostomy. 3. Mild bilateral hydronephrosis with mass effect on the distal ureters by the pelvic mass. 4. Bilateral lower lobe platelike atelectasis. Small left pleural effusion. 5. Cholelithiasis. Electronically Signed   By: Kristine Garbe M.D.   On: 07/18/2018 20:40   US Venous Img Lower Bilateral  Result Date: 07/19/2018 CLINICAL DATA:  Bilateral lower extremity edema. EXAM: BILATERAL LOWER EXTREMITY VENOUS DOPPLER ULTRASOUND TECHNIQUE: Gray-scale sonography with graded compression, as well as color Doppler and duplex ultrasound were performed to evaluate the lower extremity deep venous systems from the level of the common femoral vein and including the common femoral,  femoral, profunda femoral, popliteal and calf veins including the posterior tibial, peroneal and gastrocnemius veins when visible. The superficial great saphenous vein was also interrogated. Spectral Doppler was utilized to evaluate flow at rest and with distal augmentation maneuvers in the common femoral, femoral and popliteal veins. COMPARISON:  None. FINDINGS: RIGHT LOWER EXTREMITY Common Femoral Vein: No evidence of thrombus. Normal compressibility, respiratory phasicity and response to augmentation. Saphenofemoral Junction: No evidence of thrombus. Normal compressibility and flow on color Doppler imaging. Profunda Femoral Vein: No evidence of thrombus. Normal compressibility and flow on color Doppler imaging. Femoral Vein: Nonocclusive thrombus proximally. Popliteal Vein: No evidence of thrombus. Normal compressibility, respiratory phasicity and response to augmentation. Calf Veins: No evidence of thrombus. Normal compressibility and flow on color Doppler imaging. Superficial Great Saphenous Vein: No evidence of thrombus. Normal compressibility. Venous Reflux:  None. Other Findings:  None. LEFT LOWER EXTREMITY Common Femoral Vein: No evidence of thrombus. Normal compressibility, respiratory phasicity and response to augmentation. Saphenofemoral Junction: No evidence of thrombus. Normal compressibility and flow on color Doppler imaging. Profunda Femoral Vein: No evidence of thrombus. Normal compressibility and flow on color Doppler imaging. Femoral Vein: No evidence of thrombus. Normal compressibility, respiratory phasicity and response to augmentation. Popliteal Vein: No evidence of thrombus. Normal compressibility, respiratory phasicity and response to augmentation. Calf Veins: No evidence of thrombus. Normal compressibility and flow on color Doppler imaging. Superficial Great Saphenous Vein: No evidence of thrombus. Normal compressibility. Venous Reflux:  None. Other Findings:  None. IMPRESSION: 1.  Nonocclusive thrombus within the right proximal femoral vein. 2. No evidence of left deep venous thrombosis. These results will be called to the ordering clinician or representative by the Radiologist Assistant, and communication documented in the PACS or zVision Dashboard. Electronically Signed   By: Titus Dubin M.D.   On: 07/19/2018 12:53   Scheduled Meds: . sodium chloride   Intravenous Once  . apixaban  10 mg Oral BID  . [START ON 07/26/2018] apixaban  5 mg Oral BID  . chlorhexidine  15 mL Mouth Rinse BID  . furosemide  40 mg Intravenous BID  . mouth rinse  15 mL Mouth Rinse q12n4p  . metoCLOPramide (REGLAN) injection  10 mg Intravenous Q6H  . morphine  15 mg Oral Q12H  . OLANZapine  5 mg Oral BID  . pneumococcal 23 valent vaccine  0.5 mL Intramuscular Tomorrow-1000  . potassium chloride  40 mEq Oral BID  . prochlorperazine  10 mg Oral Q6H  . scopolamine  1 patch Transdermal Q72H  . tamsulosin  0.4 mg Oral Daily   Continuous Infusions: . sodium chloride 10 mL/hr at 07/19/18 0939  . albumin human    . cefTRIAXone (ROCEPHIN)  IV 1 g (07/20/18 1731)  . famotidine (PEPCID) IV     Principal Problem:   Abdominal wall cellulitis Active Problems:   MMMT (malignant mixed Mullerian tumor) (HCC)   S/P partial colectomy   Pelvic mass   Nausea and vomiting   Lactic acidosis   Right leg DVT ([place])  Time spent: 61mins  Kathie Dike, MD [facility] Pager [phone]  If 7PM-7AM, please contact night-coverage [email or web address] Password [email or web address] 07/20/2018, 6:57 PM    LOS: 2 days

## 2018-07-20 NOTE — Consult Note (Signed)
[facility] Consultation Oncology  Name: Krystal Baker      MRN: [id number]    Location: D622/W979-89  Date: 07/20/2018 Time:6:21 PM   REFERRING PHYSICIAN: Dr. 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  She came to the hospital ER on Saturday with redness medial to her ostomy site.  She also had a temperature of 9 and 0.8 in the axilla.  She denied any chills at that time.  Her white count was still low at that time.  She was admitted to the hospital for antibiotics.  A CT scan of the abdomen and pelvis did not reveal any abscess.  However it did reveal huge tumor in her pelvis measuring about 22 cm.  There is also peritoneal studding with mild to moderate ascites.  An ultrasound Doppler of lower extremities also revealed right femoral vein nonobstructing thrombus.  She was started on Eliquis for it.  She is also receiving IV Rocephin for skin infection.  Erythema has subsided.  She did receive 1 unit of blood transfusion while in the hospital.  Today her blood counts have improved.  Her energy levels are also slightly better today.  She is able to walk back and forth to the bathroom.  She does get short of breath on exertion.  She is eating and drinking very minimally.  Her Lasix has been increased to 40 mg twice a day in the hospital.  She is not having a lot of output of urine.  Daughter and sister at bedside today.  PAST MEDICAL HISTORY:   Past Medical History:  Diagnosis Date  . Broken shoulder    left  . 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     PAST SURGICAL HISTORY Past Surgical History:  Procedure Laterality Date  .  ABDOMINAL HYSTERECTOMY    . BREAST BIOPSY    . CARPAL TUNNEL RELEASE    . COLOSTOMY Right   . PARTIAL COLECTOMY Left     FAMILY HISTORY: Family History  Problem Relation Age of Onset  . Heart attack Father   . Leukemia Sister     SOCIAL HISTORY:  reports that she has never smoked. She has never used smokeless tobacco. She reports that she drank alcohol. She reports that she does not use drugs.  PERFORMANCE STATUS: The patient's performance status is 2 - Symptomatic, <50% confined to bed  PHYSICAL EXAM: Most Recent Vital Signs: Blood pressure 118/68, pulse (!) 111, temperature (!) 97.3 F (36.3 C), temperature source Oral, resp. rate 18, height 5' 1.5" (1.562 m), weight 162 lb 14.7 oz (73.9 kg), SpO2 91 %. BP 118/68 (BP Location: Left Arm)   Pulse (!) 111   Temp (!) 97.3 F (36.3 C) (Oral)   Resp 18   Ht 5' 1.5" (1.562 m)   Wt 162 lb 14.7 oz (73.9 kg)   SpO2 91%   BMI 30.28 kg/m  Head: Normocephalic, without obvious abnormality, atraumatic Eyes: negative Abdomen: SoftTense, distended, skin around the colostomy site has normalized. Extremities: edema With anasarca extending up to the upper thighs. Skin: Skin color, texture, turgor normal. No rashes or lesions  LABORATORY DATA:  Results for orders placed  or performed during the hospital encounter of 07/18/18 (from the past 48 hour(s))  Comprehensive metabolic panel     Status: Abnormal   Collection Time: 07/18/18  6:58 PM  Result Value Ref Range   Sodium 134 (L) 135 - 145 mmol/L   Potassium 4.1 3.5 - 5.1 mmol/L   Chloride 103 98 - 111 mmol/L   CO2 20 (L) 22 - 32 mmol/L   Glucose, Bld 96 70 - 99 mg/dL   BUN 34 (H) 8 - 23 mg/dL   Creatinine, Ser 1.24 (H) 0.44 - 1.00 mg/dL   Calcium 8.2 (L) 8.9 - 10.3 mg/dL   Total Protein 5.9 (L) 6.5 - 8.1 g/dL   Albumin 2.7 (L) 3.5 - 5.0 g/dL   AST 161 (H) 15 - 41 U/L   ALT 15 0 - 44 U/L   Alkaline Phosphatase 40 38 - 126 U/L   Total Bilirubin 0.6 0.3 - 1.2 mg/dL   GFR calc non  Af Amer 46 (L) >60 mL/min   GFR calc Af Amer 53 (L) >60 mL/min    Comment: (NOTE) The eGFR has been calculated using the CKD EPI equation. This calculation has not been validated in all clinical situations. eGFR's persistently <60 mL/min signify possible Chronic Kidney Disease.    Anion gap 11 5 - 15    Comment: Performed at [facility], [address], [place], [place] [address]  CBC WITH DIFFERENTIAL     Status: Abnormal   Collection Time: 07/18/18  6:58 PM  Result Value Ref Range   WBC 0.7 (LL) 4.0 - 10.5 K/uL    Comment: SMEAR STAINED AND AVAILABLE FOR REVIEW RESULT REPEATED AND VERIFIED CRITICAL RESULT CALLED TO, READ BACK BY AND VERIFIED WITH: MOSHER,J @ 1959 ON 07/18/18 BY JUW    RBC 3.02 (L) 3.87 - 5.11 MIL/uL   Hemoglobin 8.4 (L) 12.0 - 15.0 g/dL   HCT 25.7 (L) 36.0 - 46.0 %   MCV 85.1 78.0 - 100.0 fL   MCH 27.8 26.0 - 34.0 pg   MCHC 32.7 30.0 - 36.0 g/dL   RDW 14.8 11.5 - 15.5 %   Platelets 68 (L) 150 - 400 K/uL    Comment: SPECIMEN CHECKED FOR CLOTS   Neutrophils Relative % 24 %   Neutro Abs 0.2 1.7 - 7.7 K/uL   Lymphocytes Relative 42 %   Lymphs Abs 0.3 0.7 - 4.0 K/uL   Monocytes Relative 32 %   Monocytes Absolute 0.2 0.1 - 1.0 K/uL   Eosinophils Relative 2 %   Eosinophils Absolute 0.0 0.0 - 0.7 K/uL   Basophils Relative 0 %   Basophils Absolute 0.0 0.0 - 0.1 K/uL    Comment: Performed at [facility], [address] [place], [place] [address]  Blood Culture (routine x 2)     Status: None (Preliminary result)   Collection Time: 07/18/18  6:58 PM  Result Value Ref Range   Specimen Description LEFT ANTECUBITAL    Special Requests      BOTTLES DRAWN AEROBIC ONLY Blood Culture results may not be optimal due to an excessive volume of blood received in culture bottles   Culture      NO GROWTH 2 DAYS Performed at [facility], [address] [place], [place] [address]    Report Status PENDING   Urinalysis, Routine w reflex microscopic     Status: Abnormal    Collection Time: 07/18/18  6:59 PM  Result Value Ref Range   Color, Urine YELLOW YELLOW  APPearance HAZY (A) CLEAR   Specific Gravity, Urine 1.018 1.005 - 1.030   pH 5.0 5.0 - 8.0   Glucose, UA NEGATIVE NEGATIVE mg/dL   Hgb urine dipstick NEGATIVE NEGATIVE   Bilirubin Urine NEGATIVE NEGATIVE   Ketones, ur NEGATIVE NEGATIVE mg/dL   Protein, ur 30 (A) NEGATIVE mg/dL   Nitrite NEGATIVE NEGATIVE   Leukocytes, UA NEGATIVE NEGATIVE   RBC / HPF 0-5 0 - 5 RBC/hpf   WBC, UA 0-5 0 - 5 WBC/hpf   Bacteria, UA FEW (A) NONE SEEN   Squamous Epithelial / LPF 0-5 0 - 5   Mucus PRESENT    Hyaline Casts, UA PRESENT     Comment: Performed at [facility], [address], [place], [place] [address]  Blood Culture (routine x 2)     Status: None (Preliminary result)   Collection Time: 07/18/18  7:10 PM  Result Value Ref Range   Specimen Description BLOOD RIGHT ARM    Special Requests      BOTTLES DRAWN AEROBIC AND ANAEROBIC Blood Culture results may not be optimal due to an inadequate volume of blood received in culture bottles   Culture      NO GROWTH 2 DAYS Performed at [facility], [address] [place], [place] [address]    Report Status PENDING   I-Stat CG4 Lactic Acid, ED  (not at  [facility])     Status: Abnormal   Collection Time: 07/18/18  7:11 PM  Result Value Ref Range   Lactic Acid, Venous 2.58 (HH) 0.5 - 1.9 mmol/L   Comment NOTIFIED PHYSICIAN   I-Stat CG4 Lactic Acid, ED  (not at  [facility])     Status: None   Collection Time: 07/18/18 10:39 PM  Result Value Ref Range   Lactic Acid, Venous 1.69 0.5 - 1.9 mmol/L  MRSA PCR Screening     Status: None   Collection Time: 07/19/18  5:41 AM  Result Value Ref Range   MRSA by PCR NEGATIVE NEGATIVE    Comment:        The GeneXpert MRSA Assay (FDA approved for NASAL specimens only), is one component of a comprehensive MRSA colonization surveillance program. It is not intended to diagnose MRSA infection nor to guide or monitor treatment  for MRSA infections. Performed at [facility], [address] [place], [place] [address]   Basic metabolic panel     Status: Abnormal   Collection Time: 07/19/18  6:41 AM  Result Value Ref Range   Sodium 137 135 - 145 mmol/L   Potassium 4.1 3.5 - 5.1 mmol/L   Chloride 109 98 - 111 mmol/L   CO2 17 (L) 22 - 32 mmol/L   Glucose, Bld 77 70 - 99 mg/dL   BUN 32 (H) 8 - 23 mg/dL   Creatinine, Ser 1.11 (H) 0.44 - 1.00 mg/dL   Calcium 7.8 (L) 8.9 - 10.3 mg/dL   GFR calc non Af Amer 52 (L) >60 mL/min   GFR calc Af Amer >60 >60 mL/min    Comment: (NOTE) The eGFR has been calculated using the CKD EPI equation. This calculation has not been validated in all clinical situations. eGFR's persistently <60 mL/min signify possible Chronic Kidney Disease.    Anion gap 11 5 - 15    Comment: Performed at [facility], [address], [place], [place] [address]  CBC     Status: Abnormal   Collection Time: 07/19/18  6:41 AM  Result Value Ref Range   WBC 1.3 (  LL) 4.0 - 10.5 K/uL    Comment: REPEATED TO VERIFY CRITICAL RESULT CALLED TO, READ BACK BY AND VERIFIED WITH: MORGAN TAYLOR @ 739 ON [id number] BY HENDERSON L.    RBC 2.78 (L) 3.87 - 5.11 MIL/uL   Hemoglobin 7.8 (L) 12.0 - 15.0 g/dL   HCT 24.1 (L) 36.0 - 46.0 %   MCV 86.7 78.0 - 100.0 fL   MCH 28.1 26.0 - 34.0 pg   MCHC 32.4 30.0 - 36.0 g/dL   RDW 15.0 11.5 - 15.5 %   Platelets 74 (L) 150 - 400 K/uL    Comment: CONSISTENT WITH PREVIOUS RESULT SPECIMEN CHECKED FOR CLOTS Performed at [facility], [address] [place], [place] [address]   Type and screen [facility]     Status: None   Collection Time: 07/19/18  4:34 PM  Result Value Ref Range   ABO/RH(D) O POS    Antibody Screen NEG    Sample Expiration 07/22/2018    Unit Number [id number]    Blood Component Type RED CELLS,LR    Unit division 00    Status of Unit ISSUED,FINAL    Transfusion Status OK TO TRANSFUSE    Crossmatch Result      Compatible Performed at [facility], [address] [place] [address], [place], [place] [address]   Prepare RBC     Status: None   Collection Time: 07/19/18  4:34 PM  Result Value Ref Range   Order Confirmation      ORDER PROCESSED BY BLOOD BANK Performed at [facility], [address], [place], [place] [address]   ABO/Rh     Status: None   Collection Time: 07/19/18  4:34 PM  Result Value Ref Range   ABO/RH(D)      O POS Performed at [facility], [address] [place], [place] [address]   Basic metabolic panel     Status: Abnormal   Collection Time: 07/20/18  6:24 AM  Result Value Ref Range   Sodium 138 135 - 145 mmol/L   Potassium 4.3 3.5 - 5.1 mmol/L   Chloride 107 98 - 111 mmol/L   CO2 18 (L) 22 - 32 mmol/L   Glucose, Bld 85 70 - 99 mg/dL   BUN 34 (H) 8 - 23 mg/dL   Creatinine, Ser 1.25 (H) 0.44 - 1.00 mg/dL   Calcium 8.4 (L) 8.9 - 10.3 mg/dL   GFR calc non Af Amer 45 (L) >60 mL/min   GFR calc Af Amer 52 (L) >60 mL/min    Comment: (NOTE) The eGFR has been calculated using the CKD EPI equation. This calculation has not been validated in all clinical situations. eGFR's persistently <60 mL/min signify possible Chronic Kidney Disease.    Anion gap 13 5 - 15    Comment: Performed at [facility], [address], [place], [place] [address]  CBC     Status: Abnormal   Collection Time: 07/20/18  6:24 AM  Result Value Ref Range   WBC 7.1 4.0 - 10.5 K/uL    Comment: WHITE COUNT CONFIRMED ON SMEAR   RBC 3.55 (L) 3.87 - 5.11 MIL/uL   Hemoglobin 10.0 (L) 12.0 - 15.0 g/dL    Comment: DELTA CHECK NOTED POST TRANSFUSION SPECIMEN    HCT 30.8 (L) 36.0 - 46.0 %   MCV 86.8 78.0 - 100.0 fL   MCH 28.2 26.0 - 34.0 pg   MCHC 32.5 30.0 - 36.0 g/dL   RDW 15.1 11.5 - 15.5 %   Platelets  165 150 - 400 K/uL    Comment: SPECIMEN CHECKED FOR CLOTS PLATELET COUNT CONFIRMED BY SMEAR Performed at [facility], [address], [place], [place] [address]       RADIOGRAPHY: Dg Chest 2 View  Result Date: 07/18/2018 CLINICAL DATA:   Weakness.  Low white cell count. EXAM: CHEST - 2 VIEW COMPARISON:  None. FINDINGS: Injectable port terminates at the expected location of the cavoatrial junction. Cardiomediastinal silhouette is normal. Mediastinal contours appear intact. Low lung volumes. Bilateral pleural effusions with bilateral lower lobe atelectasis versus peribronchial airspace consolidation. Osseous structures are without acute abnormality. Soft tissues are grossly normal. IMPRESSION: Bilateral pleural effusions with bilateral lower lobe atelectasis versus peribronchial airspace consolidation. Electronically Signed   By: Fidela Salisbury M.D.   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 Stoma with possible abscess or infection. EXAM: CT ABDOMEN AND PELVIS WITH CONTRAST TECHNIQUE: Multidetector CT imaging of the abdomen and pelvis was performed using the standard protocol following bolus administration of intravenous contrast. CONTRAST:  131m ISOVUE-300 IOPAMIDOL (ISOVUE-300) INJECTION 61% COMPARISON:  None. FINDINGS: Lower chest: Linear opacities of the lung bases compatible with minor atelectasis. Small left pleural effusion. Hepatobiliary: No focal liver abnormality is seen. Dependent increased attenuation within the gallbladder likely representing cholelithiasis. No biliary ductal dilatation. Pancreas: Unremarkable. No pancreatic ductal dilatation or surrounding inflammatory changes. Spleen: Normal in size without focal abnormality. Adrenals/Urinary Tract: No adrenal glands. No focal kidney lesion. Mild bilateral hydronephrosis mass effect on the distal ureters by the pelvic mass. Bladder is not distended and anterior displaced. Stomach/Bowel: No bowel obstruction or inflammatory changes identified. Left lower quadrant colostomy. No discrete rim enhancing collection within the abdominal wall adjacent to the ostomy. There is  an 11 mm nodule inferior to the ostomy in the abdominal wall (series 5, image 15) probably representing a metastatic deposit. Vascular: No significant vascular findings. Reproductive: Pelvic structures not visualized due to tumor. Other: Pelvic mass measuring 19 x 15 x 22 cm (AP x ML x CC series 2, image 71 and series 6, image 65) filling the entirety of the pelvis. Extensive peritoneal and mesenteric carcinomatosis with innumerable nodules throughout the mesentery, omentum, and within the peritoneal gutters. Extensive tumor caking of small bowel throughout the mid abdomen. Moderate volume of ascites. Musculoskeletal: No acute or significant osseous findings. IMPRESSION: 1. Left lower quadrant colostomy. No rim enhancing collection adjacent to the colostomy in the abdominal wall to suggest abscess. 2. Pelvic mass measuring up to 22 cm occupying the entirety of pelvis and extending into lower abdomen. Extensive peritoneal carcinomatosis. Moderate volume of ascites. Small probable metastatic deposit within the abdominal wall adjacent to the colostomy. 3. Mild bilateral hydronephrosis with mass effect on the distal ureters by the pelvic mass. 4. Bilateral lower lobe platelike atelectasis. Small left pleural effusion. 5. Cholelithiasis. Electronically Signed   By: LKristine GarbeM.D.   On: 07/18/2018 20:40   UKoreaVenous Img Lower Bilateral  Result Date: 07/19/2018 CLINICAL DATA:  Bilateral lower extremity edema. EXAM: BILATERAL LOWER EXTREMITY VENOUS DOPPLER ULTRASOUND TECHNIQUE: Gray-scale sonography with graded compression, as well as color Doppler and duplex ultrasound were performed to evaluate the lower extremity deep venous systems from the level of the common femoral vein and including the common femoral, femoral, profunda femoral, popliteal and calf veins including the posterior tibial, peroneal and gastrocnemius veins when visible. The superficial great saphenous vein was  also interrogated. Spectral  Doppler was utilized to evaluate flow at rest and with distal augmentation maneuvers in the common femoral, femoral and popliteal veins. COMPARISON:  None. FINDINGS: RIGHT LOWER EXTREMITY Common Femoral Vein: No evidence of thrombus. Normal compressibility, respiratory phasicity and response to augmentation. Saphenofemoral Junction: No evidence of thrombus. Normal compressibility and flow on color Doppler imaging. Profunda Femoral Vein: No evidence of thrombus. Normal compressibility and flow on color Doppler imaging. Femoral Vein: Nonocclusive thrombus proximally. Popliteal Vein: No evidence of thrombus. Normal compressibility, respiratory phasicity and response to augmentation. Calf Veins: No evidence of thrombus. Normal compressibility and flow on color Doppler imaging. Superficial Great Saphenous Vein: No evidence of thrombus. Normal compressibility. Venous Reflux:  None. Other Findings:  None. LEFT LOWER EXTREMITY Common Femoral Vein: No evidence of thrombus. Normal compressibility, respiratory phasicity and response to augmentation. Saphenofemoral Junction: No evidence of thrombus. Normal compressibility and flow on color Doppler imaging. Profunda Femoral Vein: No evidence of thrombus. Normal compressibility and flow on color Doppler imaging. Femoral Vein: No evidence of thrombus. Normal compressibility, respiratory phasicity and response to augmentation. Popliteal Vein: No evidence of thrombus. Normal compressibility, respiratory phasicity and response to augmentation. Calf Veins: No evidence of thrombus. Normal compressibility and flow on color Doppler imaging. Superficial Great Saphenous Vein: No evidence of thrombus. Normal compressibility. Venous Reflux:  None. Other Findings:  None. IMPRESSION: 1. Nonocclusive thrombus within the right proximal femoral vein. 2. No evidence of left deep venous thrombosis. These results will be called to the ordering clinician or representative by the Radiologist  Assistant, and communication documented in the PACS or zVision Dashboard. Electronically Signed   By: Titus Dubin M.D.   On: 07/19/2018 12:53       ASSESSMENT and plan:  1. 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  She received first cycle of chemotherapy on 07/09/2018 with vincristine, doxorubicin, and cyclophosphamide. -Her counts have recovered with a normal white count and platelet count today.  Hemoglobin also improved after transfusion. - I have reviewed the results of the CT scan of the abdomen and pelvis with the patient and her daughter in detail.  This scan has to be compared with scan done at [facility] in [place] on 07/06/2018. - She has been scheduled for chemotherapy tentatively on 08-04-2018 for cycle 2.  2.  Cellulitis: -She is receiving Rocephin.  Cellulitis of the skin medial to the ostomy site has improved.  She can be switched to oral antibiotic at the time of discharge.  3.  Right leg DVT: -She had a nonocclusive thrombus in the right proximal femoral vein.  She is currently started on Eliquis last night.  She is tolerating it very well so far.  4.  Anasarca: - Consider giving albumin along with possibility of switching Lasix to Bumex.  This was discussed with Dr. Roderic Palau.  All questions were answered. The patient knows to call the clinic with any problems, questions or concerns. We can certainly see the patient much sooner if necessary.   Derek Jack

## 2018-07-20 NOTE — Consult Note (Signed)
[place] Nurse ostomy consult note Stoma type/location: LLQ, end colostomy Stomal assessment/size: 1 3/4" round, budded, pink, moist Peristomal assessment: mucocutaneous separation at 9 oclock with associated redness that extends medially aprox. 3cm . Different than image on patient's phone, the dark site now has blistered and unroofed.  I have explained that this may be some type of localized reaction to a stitch. Very localized to this one site and she has had NO LEAKAGE that would have irritated her skin. She has been wearing 1pc convex for some time based on her [place] nurse's recommendation from [place].  She is very knowledgeable about her ostomy care and her daughter is a NICU NP at [employer] and her family friend at the bedside is a Marine scientist as well.  Treatment options for stomal/peristomal skin: used silver hydrofiber for antimicrobial affects and absorption over the area of separation and I have covered the other affected skin with a ostomy barrier ring that has been flattened out to be much thinner.  Her daughter has face timed with me during my visit and her family friend is at the bedside.  The patient requested that I cut the peak a boo window away from the pouch. She uses a pouch with filter and this was the only option I had with me to use with a filter. She likes to use ostomy paste along the cut edge of the skin barrier as well, I have used the patient's supply as we do not carry this product in our system.   Skin was prepped with skin barrier wipe just prior to placement.  Output  Ostomy pouching: 1pc./2pc.  Education provided:  Explained mucocutaneous separation  Explained wound care provided Recommended more frequent pouch changes (she is currently changing weekly) until the affected  has improved.   [place] nurse will follow up Wed or Thursday if patient still inpatient to re-eval affected areas [place], [place], [place]

## 2018-07-21 DIAGNOSIS — R1900 Intra-abdominal and pelvic swelling, mass and lump, unspecified site: Secondary | ICD-10-CM

## 2018-07-21 LAB — CBC
HEMATOCRIT: 30 % — AB (ref 36.0–46.0)
Hemoglobin: 9.7 g/dL — ABNORMAL LOW (ref 12.0–15.0)
MCH: 28.1 pg (ref 26.0–34.0)
MCHC: 32.3 g/dL (ref 30.0–36.0)
MCV: 87 fL (ref 78.0–100.0)
PLATELETS: 265 10*3/uL (ref 150–400)
RBC: 3.45 MIL/uL — ABNORMAL LOW (ref 3.87–5.11)
RDW: 15.3 % (ref 11.5–15.5)
WBC: 16.7 10*3/uL — AB (ref 4.0–10.5)

## 2018-07-21 LAB — HEPATIC FUNCTION PANEL
ALT: 10 U/L (ref 0–44)
AST: 162 U/L — AB (ref 15–41)
Albumin: 2.8 g/dL — ABNORMAL LOW (ref 3.5–5.0)
Alkaline Phosphatase: 57 U/L (ref 38–126)
BILIRUBIN TOTAL: 0.6 mg/dL (ref 0.3–1.2)
Total Protein: 5.9 g/dL — ABNORMAL LOW (ref 6.5–8.1)

## 2018-07-21 LAB — BASIC METABOLIC PANEL
Anion gap: 12 (ref 5–15)
BUN: 34 mg/dL — AB (ref 8–23)
CHLORIDE: 108 mmol/L (ref 98–111)
CO2: 18 mmol/L — ABNORMAL LOW (ref 22–32)
CREATININE: 1.47 mg/dL — AB (ref 0.44–1.00)
Calcium: 8.6 mg/dL — ABNORMAL LOW (ref 8.9–10.3)
GFR calc Af Amer: 43 mL/min — ABNORMAL LOW (ref >60)
GFR calc non Af Amer: 37 mL/min — ABNORMAL LOW (ref >60)
GLUCOSE: 82 mg/dL (ref 70–99)
POTASSIUM: 4.1 mmol/L (ref 3.5–5.1)
SODIUM: 138 mmol/L (ref 135–145)

## 2018-07-21 MED ORDER — GRANISETRON 3.1 MG/24HR TD PTCH: MEDICATED_PATCH | TRANSDERMAL | 0 refills | Status: AC

## 2018-07-21 MED ORDER — BUMETANIDE 1 MG PO TABS: 1.0000 mg | ORAL_TABLET | Freq: Every day | ORAL | 11 refills | Status: AC | PRN

## 2018-07-21 MED ORDER — CEPHALEXIN 500 MG PO CAPS: 500.0000 mg | ORAL_CAPSULE | Freq: Three times a day (TID) | ORAL | 0 refills | Status: AC

## 2018-07-21 MED ORDER — ONDANSETRON 8 MG PO TBDP: 8.0000 mg | ORAL_TABLET | Freq: Three times a day (TID) | ORAL | 0 refills | Status: AC | PRN

## 2018-07-21 MED ORDER — CEPHALEXIN 500 MG PO CAPS: 500.0000 mg | ORAL_CAPSULE | Freq: Two times a day (BID) | ORAL | Status: DC

## 2018-07-21 MED ORDER — LORATADINE 10 MG PO TABS: 10.0000 mg | ORAL_TABLET | Freq: Every day | ORAL | Status: AC

## 2018-07-21 MED ORDER — POTASSIUM CHLORIDE 20 MEQ/15ML (10%) PO SOLN: 20.0000 meq | Freq: Every day | ORAL | 0 refills | Status: AC

## 2018-07-21 MED ORDER — APIXABAN 5 MG PO TABS: ORAL_TABLET | ORAL | 1 refills | Status: AC

## 2018-07-21 NOTE — Plan of Care (Signed)
Adequate for discharge.

## 2018-07-21 NOTE — Discharge Summary (Addendum)
Physician Discharge Summary  Krystal Baker LTJ:[id number] DOB: [birth year] DOA: 07/18/2018  PCP: Krystal Hong, MD  Admit date: 07/18/2018 Discharge date: 07/21/2018  Admitted From: Home Disposition: Home  Recommendations for Outpatient Follow-up:  1. Follow up with PCP in 1-2 weeks 2. Please obtain BMP/CBC in one week 3. Follow-up with oncology to resume chemotherapy treatments.  Her long-term prognosis is poor and I would consider her high risk for readmission.  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    Discharge Diagnoses:  Principal Problem:   Abdominal wall cellulitis Active Problems:   MMMT (malignant mixed Mullerian tumor) (HCC)   S/P partial colectomy   Pelvic mass   Nausea and vomiting   Lactic acidosis   Right leg DVT ([place])  1. Cellulitis of abdominal wall.  Treated with intravenous antibiotics.  Overall erythema has improved.  She is been transitioned to oral Keflex. 2. Lactic acidosis.  Resolved after receiving IV fluids 3. Nausea and emesis.  Likely related to underlying pelvic mass and carcinomatosis.  Continue antiemetics 4. Malignant mixed mullerian tumor.  Appears to be very aggressive malignancy and is rapidly progressing.  She is currently receiving chemotherapy with next treatment plan for the end of this month.  She may have staging scans after that treatment.  She has been told that chemotherapy is palliative and not curative.  Long-term prognosis is poor. 5. Status post partial colectomy, seen by ostomy nurse. 6. Pancytopenia.   Related to chemotherapy.  She did receive fill gastrium for neutropenia and was transfused PRBCs for anemia.  All of her cell lines have improved. 7. Right lower extremity DVT.  She was started on anticoagulation with Eliquis.  This required prior authorization and was approved prior to discharge.  She will likely need lifelong therapy. 8. Peripheral edema.  She may have some element of anasarca related to hypoalbuminemia.  She was given IV Lasix with albumin infusions, but her creatinine has started to trend up.  Diuretics have been transitioned to p.o. 9. Mild AKI.  She was noted to have increasing creatinine related to diuretics.  Imaging did indicate mild hydronephrosis related to compression from underlying mass.  I suspect that her upper trend in creatinine is likely related to diuretic use.  This can be followed as an outpatient.  Discharge Instructions  Discharge Instructions    Diet - low sodium heart healthy   Complete by:  As directed    Increase activity slowly   Complete by:  As directed      Allergies as of 07/21/2018   No Known Allergies     Medication List    STOP taking these medications   furosemide 20 MG tablet Commonly known as:  LASIX   ondansetron 8 MG tablet Commonly known as:  ZOFRAN   scopolamine 1 MG/3DAYS Commonly known as:  TRANSDERM-SCOP     TAKE these medications   apixaban 5 MG Tabs tablet Commonly known as:  ELIQUIS Take 10mg  po bid for 7 days then 5mg  po bid   bumetanide 1 MG tablet Commonly known as:  BUMEX Take 1 tablet (1 mg total) by mouth daily as needed (swelling).  cephALEXin 500 MG capsule Commonly known as:  KEFLEX Take 1 capsule (500 mg total) by mouth every 8 (eight) hours for 5 days.   granisetron 3.1 MG/24HR Commonly known as:  SANCUSO Apply to skin starting 24 hours before chemotherapy. Remove after 7 days.   loratadine 10 MG tablet Commonly known as:  CLARITIN Take 1 tablet (10 mg total) by mouth daily. What changed:     how much to take  when to take this   morphine 15 MG 12 hr tablet Commonly known as:  MS CONTIN Take 15 mg by mouth every 12 (twelve) hours. What changed:  Another medication with the same name was removed. Continue taking this medication, and follow the directions you see here.   OLANZapine 5 MG tablet Commonly known as:  ZYPREXA Take 5 mg by mouth 2 (two) times daily.   omeprazole 20 MG capsule Commonly known as:  PRILOSEC Take by mouth.   ondansetron 8 MG disintegrating tablet Commonly known as:  ZOFRAN-ODT Take 1 tablet (8 mg total) by mouth every 8 (eight) hours as needed for nausea or vomiting.   polyethylene glycol packet Commonly known as:  MIRALAX / GLYCOLAX Take 17 g by mouth daily as needed.   potassium chloride 20 MEQ/15ML (10%) Soln Take 15 mLs (20 mEq total) by mouth daily.   prochlorperazine 10 MG tablet Commonly known as:  COMPAZINE Take by mouth.   senna-docusate 8.6-50 MG tablet Commonly known as:  Senokot-S Take by mouth.   sodium bicarbonate/sodium chloride Soln Swish and spit 10 mL every 4 (four) hours while awake.  Dissolve 2 teaspoon in 1 quart of water.   sucralfate 1 GM/10ML suspension Commonly known as:  CARAFATE Take by mouth.   tamsulosin 0.4 MG Caps capsule Commonly known as:  FLOMAX Take 0.4 mg by mouth daily.   traMADol 50 MG tablet Commonly known as:  ULTRAM Take 50 mg by mouth every 6 (six) hours as needed for moderate pain.       No Known Allergies  Consultations:  Oncology   Procedures/Studies: Dg Chest 2 View  Result Date: 07/18/2018 CLINICAL DATA:  Weakness.  Low white cell count. EXAM: CHEST - 2 VIEW COMPARISON:  None. FINDINGS: Injectable port terminates at the expected location of the cavoatrial junction. Cardiomediastinal silhouette is normal. Mediastinal contours appear intact. Low lung volumes. Bilateral pleural effusions with bilateral lower lobe atelectasis versus peribronchial airspace consolidation.  Osseous structures are without acute abnormality. Soft tissues are grossly normal. IMPRESSION: Bilateral pleural effusions with bilateral lower lobe atelectasis versus peribronchial airspace consolidation. Electronically Signed   By: Fidela Salisbury M.D.   On: 07/18/2018 20:47   Ct Abdomen Pelvis W Contrast  Result Date: 07/18/2018 CLINICAL DATA:  63 y/o F; malignant mixed mullerian tumor on chemotherapy. Stoma with possible abscess or infection. EXAM: CT ABDOMEN AND PELVIS WITH CONTRAST TECHNIQUE: Multidetector CT imaging of the abdomen and pelvis was performed using the standard protocol following bolus administration of intravenous contrast. CONTRAST:  136mL ISOVUE-300 IOPAMIDOL (ISOVUE-300) INJECTION 61% COMPARISON:  None. FINDINGS: Lower chest: Linear opacities of the lung bases compatible with minor atelectasis. Small left pleural effusion. Hepatobiliary: No focal liver abnormality is seen. Dependent increased attenuation within the gallbladder likely representing cholelithiasis. No biliary ductal dilatation. Pancreas: Unremarkable. No pancreatic ductal dilatation or surrounding inflammatory changes. Spleen: Normal in size without focal abnormality. Adrenals/Urinary Tract: No adrenal glands. No focal kidney lesion. Mild bilateral hydronephrosis mass effect on the distal ureters by the pelvic mass. Bladder is not  distended and anterior displaced. Stomach/Bowel: No bowel obstruction or inflammatory changes identified. Left lower quadrant colostomy. No discrete rim enhancing collection within the abdominal wall adjacent to the ostomy. There is an 11 mm nodule inferior to the ostomy in the abdominal wall (series 5, image 15) probably representing a metastatic deposit. Vascular: No significant vascular findings. Reproductive: Pelvic structures not visualized due to tumor. Other: Pelvic mass measuring 19 x 15 x 22 cm (AP x ML x CC series 2, image 71 and series 6, image 65) filling the entirety of the pelvis.  Extensive peritoneal and mesenteric carcinomatosis with innumerable nodules throughout the mesentery, omentum, and within the peritoneal gutters. Extensive tumor caking of small bowel throughout the mid abdomen. Moderate volume of ascites. Musculoskeletal: No acute or significant osseous findings. IMPRESSION: 1. Left lower quadrant colostomy. No rim enhancing collection adjacent to the colostomy in the abdominal wall to suggest abscess. 2. Pelvic mass measuring up to 22 cm occupying the entirety of pelvis and extending into lower abdomen. Extensive peritoneal carcinomatosis. Moderate volume of ascites. Small probable metastatic deposit within the abdominal wall adjacent to the colostomy. 3. Mild bilateral hydronephrosis with mass effect on the distal ureters by the pelvic mass. 4. Bilateral lower lobe platelike atelectasis. Small left pleural effusion. 5. Cholelithiasis. Electronically Signed   By: Kristine Garbe M.D.   On: 07/18/2018 20:40   US Venous Img Lower Bilateral  Result Date: 07/19/2018 CLINICAL DATA:  Bilateral lower extremity edema. EXAM: BILATERAL LOWER EXTREMITY VENOUS DOPPLER ULTRASOUND TECHNIQUE: Gray-scale sonography with graded compression, as well as color Doppler and duplex ultrasound were performed to evaluate the lower extremity deep venous systems from the level of the common femoral vein and including the common femoral, femoral, profunda femoral, popliteal and calf veins including the posterior tibial, peroneal and gastrocnemius veins when visible. The superficial great saphenous vein was also interrogated. Spectral Doppler was utilized to evaluate flow at rest and with distal augmentation maneuvers in the common femoral, femoral and popliteal veins. COMPARISON:  None. FINDINGS: RIGHT LOWER EXTREMITY Common Femoral Vein: No evidence of thrombus. Normal compressibility, respiratory phasicity and response to augmentation. Saphenofemoral Junction: No evidence of thrombus. Normal  compressibility and flow on color Doppler imaging. Profunda Femoral Vein: No evidence of thrombus. Normal compressibility and flow on color Doppler imaging. Femoral Vein: Nonocclusive thrombus proximally. Popliteal Vein: No evidence of thrombus. Normal compressibility, respiratory phasicity and response to augmentation. Calf Veins: No evidence of thrombus. Normal compressibility and flow on color Doppler imaging. Superficial Great Saphenous Vein: No evidence of thrombus. Normal compressibility. Venous Reflux:  None. Other Findings:  None. LEFT LOWER EXTREMITY Common Femoral Vein: No evidence of thrombus. Normal compressibility, respiratory phasicity and response to augmentation. Saphenofemoral Junction: No evidence of thrombus. Normal compressibility and flow on color Doppler imaging. Profunda Femoral Vein: No evidence of thrombus. Normal compressibility and flow on color Doppler imaging. Femoral Vein: No evidence of thrombus. Normal compressibility, respiratory phasicity and response to augmentation. Popliteal Vein: No evidence of thrombus. Normal compressibility, respiratory phasicity and response to augmentation. Calf Veins: No evidence of thrombus. Normal compressibility and flow on color Doppler imaging. Superficial Great Saphenous Vein: No evidence of thrombus. Normal compressibility. Venous Reflux:  None. Other Findings:  None. IMPRESSION: 1. Nonocclusive thrombus within the right proximal femoral vein. 2. No evidence of left deep venous thrombosis. These results will be called to the ordering clinician or representative by the Radiologist Assistant, and communication documented in the PACS or zVision Dashboard. Electronically Signed   By: Gwyndolyn Saxon  Marzella Schlein M.D.   On: 07/19/2018 12:53   [place] Ascites (abdomen Limited)  Result Date: 07/14/2018 CLINICAL DATA:  Malignant mixed mullerian tumor. EXAM: LIMITED ABDOMEN ULTRASOUND FOR ASCITES TECHNIQUE: Limited ultrasound survey for ascites was performed in all four  abdominal quadrants. COMPARISON:  None. FINDINGS: Minimal amount of ascites is noted in the right upper quadrant around the liver. No ascites is noted in the other 3 quadrants of the abdomen. IMPRESSION: Minimal ascites is noted.  Paracentesis was not performed. Electronically Signed   By: Marijo Conception, M.D.   On: 07/14/2018 14:54       Subjective: Is feeling better today.  No vomiting today.  Nausea is better.  Wants to go home  Discharge Exam: Vitals:   07/21/18 0549 07/21/18 1451  BP: 105/68 112/72  Pulse: (!) 111 (!) 117  Resp: 18 18  Temp: (!) 97.5 F (36.4 C) 97.6 F (36.4 C)  SpO2: 97% 94%   Vitals:   07/20/18 2114 07/20/18 2152 07/21/18 0549 07/21/18 1451  BP: 112/70  105/68 112/72  Pulse: (!) 116  (!) 111 (!) 117  Resp: 18  18 18   Temp: 97.7 F (36.5 C)  (!) 97.5 F (36.4 C) 97.6 F (36.4 C)  TempSrc: Oral  Oral Oral  SpO2: 96% 96% 97% 94%  Weight:      Height:        General: Pt is alert, awake, not in acute distress Cardiovascular: Mildly tachycardic, S1/S2 +, no rubs, no gallops Respiratory: CTA bilaterally, no wheezing, no rhonchi Abdominal: Distended, colostomy left lower quadrant, bowel sounds + Extremities: 1+ edema, no cyanosis Skin: Erythema over abdominal incision improving    The results of significant diagnostics from this hospitalization (including imaging, microbiology, ancillary and laboratory) are listed below for reference.     Microbiology: Recent Results (from the past 240 hour(s))  Blood Culture (routine x 2)     Status: None (Preliminary result)   Collection Time: 07/18/18  6:58 PM  Result Value Ref Range Status   Specimen Description LEFT ANTECUBITAL  Final   Special Requests   Final    BOTTLES DRAWN AEROBIC ONLY Blood Culture results may not be optimal due to an excessive volume of blood received in culture bottles   Culture   Final    NO GROWTH 3 DAYS Performed at [facility], [address], [place], [place] [address]     Report Status PENDING  Incomplete  Blood Culture (routine x 2)     Status: None (Preliminary result)   Collection Time: 07/18/18  7:10 PM  Result Value Ref Range Status   Specimen Description BLOOD RIGHT ARM  Final   Special Requests   Final    BOTTLES DRAWN AEROBIC AND ANAEROBIC Blood Culture results may not be optimal due to an inadequate volume of blood received in culture bottles   Culture   Final    NO GROWTH 3 DAYS Performed at [facility], [address], [place], [place] [address]    Report Status PENDING  Incomplete  MRSA PCR Screening     Status: None   Collection Time: 07/19/18  5:41 AM  Result Value Ref Range Status   MRSA by PCR NEGATIVE NEGATIVE Final    Comment:        The GeneXpert MRSA Assay (FDA approved for NASAL specimens only), is one component of a comprehensive MRSA colonization surveillance program. It is not intended to diagnose MRSA infection nor to guide or monitor treatment for  MRSA infections. Performed at [facility], [address] [place], [place] [address]      Labs: BNP (last 3 results) No results for input(s): BNP in the last 8760 hours. Basic Metabolic Panel: Recent Labs  Lab 07/16/18 1054 07/18/18 1858 07/19/18 0641 07/20/18 0624 07/21/18 0555  NA 134* 134* 137 138 138  K 4.1 4.1 4.1 4.3 4.1  CL 104 103 109 107 108  CO2 20* 20* 17* 18* 18*  GLUCOSE 109* 96 77 85 82  BUN 28* 34* 32* 34* 34*  CREATININE 0.92 1.24* 1.11* 1.25* 1.47*  CALCIUM 8.0* 8.2* 7.8* 8.4* 8.6*  MG 2.2  --   --   --   --    Liver Function Tests: Recent Labs  Lab 07/16/18 1054 07/18/18 1858 07/21/18 0555  AST 175* 161* 162*  ALT 15 15 10   ALKPHOS 39 40 57  BILITOT 0.8 0.6 0.6  PROT 5.8* 5.9* 5.9*  ALBUMIN 2.9* 2.7* 2.8*   No results for input(s): LIPASE, AMYLASE in the last 168 hours. No results for input(s): AMMONIA in the last 168 hours. CBC: Recent Labs  Lab 07/16/18 1054 07/18/18 1858 07/19/18 0641 07/20/18 0624 07/21/18 0555  WBC  0.2* 0.7* 1.3* 7.1 16.7*  NEUTROABS 0.0 0.2  --   --   --   HGB 8.6* 8.4* 7.8* 10.0* 9.7*  HCT 26.3* 25.7* 24.1* 30.8* 30.0*  MCV 86.2 85.1 86.7 86.8 87.0  PLT 71* 68* 74* 165 265   Cardiac Enzymes: No results for input(s): CKTOTAL, CKMB, CKMBINDEX, TROPONINI in the last 168 hours. BNP: Invalid input(s): POCBNP CBG: No results for input(s): GLUCAP in the last 168 hours. D-Dimer No results for input(s): DDIMER in the last 72 hours. Hgb A1c No results for input(s): HGBA1C in the last 72 hours. Lipid Profile No results for input(s): CHOL, HDL, LDLCALC, TRIG, CHOLHDL, LDLDIRECT in the last 72 hours. Thyroid function studies No results for input(s): TSH, T4TOTAL, T3FREE, THYROIDAB in the last 72 hours.  Invalid input(s): FREET3 Anemia work up No results for input(s): VITAMINB12, FOLATE, FERRITIN, TIBC, IRON, RETICCTPCT in the last 72 hours. Urinalysis    Component Value Date/Time   COLORURINE YELLOW 07/18/2018 1859   APPEARANCEUR HAZY (A) 07/18/2018 1859   LABSPEC 1.018 07/18/2018 1859   PHURINE 5.0 07/18/2018 1859   GLUCOSEU NEGATIVE 07/18/2018 1859   HGBUR NEGATIVE 07/18/2018 1859   BILIRUBINUR NEGATIVE 07/18/2018 1859   KETONESUR NEGATIVE 07/18/2018 1859   PROTEINUR 30 (A) 07/18/2018 1859   NITRITE NEGATIVE 07/18/2018 1859   LEUKOCYTESUR NEGATIVE 07/18/2018 1859   Sepsis Labs Invalid input(s): PROCALCITONIN,  WBC,  LACTICIDVEN Microbiology Recent Results (from the past 240 hour(s))  Blood Culture (routine x 2)     Status: None (Preliminary result)   Collection Time: 07/18/18  6:58 PM  Result Value Ref Range Status   Specimen Description LEFT ANTECUBITAL  Final   Special Requests   Final    BOTTLES DRAWN AEROBIC ONLY Blood Culture results may not be optimal due to an excessive volume of blood received in culture bottles   Culture   Final    NO GROWTH 3 DAYS Performed at [facility], [address] [place], [place] [address]    Report Status PENDING  Incomplete   Blood Culture (routine x 2)     Status: None (Preliminary result)   Collection Time: 07/18/18  7:10 PM  Result Value Ref Range Status   Specimen Description BLOOD RIGHT ARM  Final   Special Requests  Final    BOTTLES DRAWN AEROBIC AND ANAEROBIC Blood Culture results may not be optimal due to an inadequate volume of blood received in culture bottles   Culture   Final    NO GROWTH 3 DAYS Performed at [facility], [address], [place], [place] [address]    Report Status PENDING  Incomplete  MRSA PCR Screening     Status: None   Collection Time: 07/19/18  5:41 AM  Result Value Ref Range Status   MRSA by PCR NEGATIVE NEGATIVE Final    Comment:        The GeneXpert MRSA Assay (FDA approved for NASAL specimens only), is one component of a comprehensive MRSA colonization surveillance program. It is not intended to diagnose MRSA infection nor to guide or monitor treatment for MRSA infections. Performed at [facility], [address], [place], [place] [address]      Time coordinating discharge: 25mins  SIGNED:   Kathie Dike, MD  [facility] 07/21/2018, 6:40 PM Pager   If 7PM-7AM, please contact night-coverage [email or web address] Password [email or web address]

## 2018-07-21 NOTE — Care Management (Addendum)
CM consulted for home oxygen. Pt on palliative services through [facility], receiving palliative chemo. Pt does not desaturate on room air at this time. CM has contacted Hospice rep who verifies that pt is unable to get home O2 with out qualifying at this time. If she were to choose to be admitted to hospice services she would be able to get home O2 without desaturating. CM discussed with patient who feels she will not be so SOB once she gets home, feels it is related to anxiety from being in hospital. CM will route DC summary to Hospice rep.

## 2018-07-21 NOTE — Discharge Instructions (Signed)
Information on my medicine - ELIQUIS (apixaban)  This medication education was reviewed with me or my healthcare representative as part of my discharge preparation.  Why was Eliquis prescribed for you? Eliquis was prescribed to treat blood clots that may have been found in the veins of your legs (deep vein thrombosis) or in your lungs (pulmonary embolism) and to reduce the risk of them occurring again.  What do You need to know about Eliquis ? The starting dose is 10 mg (two 5 mg tablets) taken TWICE daily for the FIRST SEVEN (7) DAYS, then on the dose is reduced to ONE 5 mg tablet taken TWICE daily.  Eliquis may be taken with or without food.   Try to take the dose about the same time in the morning and in the evening. If you have difficulty swallowing the tablet whole please discuss with your pharmacist how to take the medication safely.  Take Eliquis exactly as prescribed and DO NOT stop taking Eliquis without talking to the doctor who prescribed the medication.  Stopping may increase your risk of developing a new blood clot.  Refill your prescription before you run out.  After discharge, you should have regular check-up appointments with your healthcare provider that is prescribing your Eliquis.    What do you do if you miss a dose? If a dose of ELIQUIS is not taken at the scheduled time, take it as soon as possible on the same day and twice-daily administration should be resumed. The dose should not be doubled to make up for a missed dose.  Important Safety Information A possible side effect of Eliquis is bleeding. You should call your healthcare provider right away if you experience any of the following: ? Bleeding from an injury or your nose that does not stop. ? Unusual colored urine (red or dark brown) or unusual colored stools (red or black). ? Unusual bruising for unknown reasons. ? A serious fall or if you hit your head (even if there is no bleeding).  Some medicines  may interact with Eliquis and might increase your risk of bleeding or clotting while on Eliquis. To help avoid this, consult your healthcare provider or pharmacist prior to using any new prescription or non-prescription medications, including herbals, vitamins, non-steroidal anti-inflammatory drugs (NSAIDs) and supplements.  This website has more information on Eliquis (apixaban): http://www.eliquis.com/eliquis/home

## 2018-07-21 NOTE — Progress Notes (Signed)
Discharge instructions reviewed with pt/daughter.  Pt/daughter verbalized understanding.  PIV removed intact w/o S&S of complications.  Central line left in place for pt's outpatient chemotherapy.  Pt discharged in stable condition in wheelchair into the care of her family via private vehicle.

## 2018-07-21 NOTE — Progress Notes (Signed)
SATURATION QUALIFICATIONS: (This note is used to comply with regulatory documentation for home oxygen)  Patient Saturations on Room Air at Rest = 95%  Patient Saturations on Room Air while Ambulating = 93%   

## 2018-07-22 ENCOUNTER — Other Ambulatory Visit (HOSPITAL_COMMUNITY): Payer: Self-pay

## 2018-07-22 DIAGNOSIS — R180 Malignant ascites: Secondary | ICD-10-CM

## 2018-07-23 ENCOUNTER — Other Ambulatory Visit: Payer: Self-pay

## 2018-07-23 ENCOUNTER — Inpatient Hospital Stay (HOSPITAL_COMMUNITY): Payer: BLUE CROSS/BLUE SHIELD

## 2018-07-23 ENCOUNTER — Encounter (HOSPITAL_COMMUNITY): Payer: Self-pay | Admitting: Hematology

## 2018-07-23 ENCOUNTER — Inpatient Hospital Stay (HOSPITAL_BASED_OUTPATIENT_CLINIC_OR_DEPARTMENT_OTHER): Payer: BLUE CROSS/BLUE SHIELD | Admitting: Hematology

## 2018-07-23 VITALS — BP 107/60 | HR 116 | Temp 97.3°F | Resp 20 | Wt 168.2 lb

## 2018-07-23 DIAGNOSIS — R601 Generalized edema: Secondary | ICD-10-CM

## 2018-07-23 DIAGNOSIS — N1330 Unspecified hydronephrosis: Secondary | ICD-10-CM

## 2018-07-23 DIAGNOSIS — D72829 Elevated white blood cell count, unspecified: Secondary | ICD-10-CM

## 2018-07-23 DIAGNOSIS — I82411 Acute embolism and thrombosis of right femoral vein: Secondary | ICD-10-CM

## 2018-07-23 DIAGNOSIS — R180 Malignant ascites: Secondary | ICD-10-CM

## 2018-07-23 DIAGNOSIS — J90 Pleural effusion, not elsewhere classified: Secondary | ICD-10-CM | POA: Diagnosis not present

## 2018-07-23 DIAGNOSIS — Z90722 Acquired absence of ovaries, bilateral: Secondary | ICD-10-CM | POA: Diagnosis not present

## 2018-07-23 DIAGNOSIS — Z7901 Long term (current) use of anticoagulants: Secondary | ICD-10-CM

## 2018-07-23 DIAGNOSIS — C786 Secondary malignant neoplasm of retroperitoneum and peritoneum: Secondary | ICD-10-CM | POA: Diagnosis not present

## 2018-07-23 DIAGNOSIS — C549 Malignant neoplasm of corpus uteri, unspecified: Secondary | ICD-10-CM

## 2018-07-23 DIAGNOSIS — L03311 Cellulitis of abdominal wall: Secondary | ICD-10-CM | POA: Diagnosis not present

## 2018-07-23 DIAGNOSIS — C763 Malignant neoplasm of pelvis: Secondary | ICD-10-CM

## 2018-07-23 DIAGNOSIS — Z90710 Acquired absence of both cervix and uterus: Secondary | ICD-10-CM | POA: Diagnosis not present

## 2018-07-23 DIAGNOSIS — Z79899 Other long term (current) drug therapy: Secondary | ICD-10-CM

## 2018-07-23 DIAGNOSIS — Z9221 Personal history of antineoplastic chemotherapy: Secondary | ICD-10-CM | POA: Diagnosis not present

## 2018-07-23 DIAGNOSIS — M7989 Other specified soft tissue disorders: Secondary | ICD-10-CM | POA: Diagnosis not present

## 2018-07-23 DIAGNOSIS — R7989 Other specified abnormal findings of blood chemistry: Secondary | ICD-10-CM | POA: Diagnosis not present

## 2018-07-23 DIAGNOSIS — J9811 Atelectasis: Secondary | ICD-10-CM | POA: Diagnosis not present

## 2018-07-23 DIAGNOSIS — R7881 Bacteremia: Secondary | ICD-10-CM

## 2018-07-23 DIAGNOSIS — Z933 Colostomy status: Secondary | ICD-10-CM | POA: Diagnosis not present

## 2018-07-23 LAB — COMPREHENSIVE METABOLIC PANEL
ALBUMIN: 2.9 g/dL — AB (ref 3.5–5.0)
ALT: 14 U/L (ref 0–44)
AST: 174 U/L — ABNORMAL HIGH (ref 15–41)
Alkaline Phosphatase: 92 U/L (ref 38–126)
Anion gap: 12 (ref 5–15)
BUN: 42 mg/dL — ABNORMAL HIGH (ref 8–23)
CHLORIDE: 104 mmol/L (ref 98–111)
CO2: 18 mmol/L — AB (ref 22–32)
CREATININE: 1.93 mg/dL — AB (ref 0.44–1.00)
Calcium: 9.1 mg/dL (ref 8.9–10.3)
GFR calc non Af Amer: 27 mL/min — ABNORMAL LOW (ref >60)
GFR, EST AFRICAN AMERICAN: 31 mL/min — AB (ref >60)
GLUCOSE: 104 mg/dL — AB (ref 70–99)
Potassium: 4.3 mmol/L (ref 3.5–5.1)
SODIUM: 134 mmol/L — AB (ref 135–145)
Total Bilirubin: 0.4 mg/dL (ref 0.3–1.2)
Total Protein: 5.8 g/dL — ABNORMAL LOW (ref 6.5–8.1)

## 2018-07-23 LAB — CBC WITH DIFFERENTIAL/PLATELET
BAND NEUTROPHILS: 9 %
BASOS ABS: 0 10*3/uL (ref 0.0–0.1)
BASOS PCT: 0 %
Blasts: 0 %
EOS ABS: 0 10*3/uL (ref 0.0–0.7)
EOS PCT: 0 %
HCT: 31.6 % — ABNORMAL LOW (ref 36.0–46.0)
Hemoglobin: 10.2 g/dL — ABNORMAL LOW (ref 12.0–15.0)
LYMPHS ABS: 0.4 10*3/uL — AB (ref 0.7–4.0)
Lymphocytes Relative: 1 %
MCH: 28.3 pg (ref 26.0–34.0)
MCHC: 32.3 g/dL (ref 30.0–36.0)
MCV: 87.5 fL (ref 78.0–100.0)
METAMYELOCYTES PCT: 5 %
MONO ABS: 2.7 10*3/uL — AB (ref 0.1–1.0)
MYELOCYTES: 2 %
Monocytes Relative: 7 %
NRBC: 0 /100{WBCs}
Neutro Abs: 34.9 10*3/uL — ABNORMAL HIGH (ref 1.7–7.7)
Neutrophils Relative %: 76 %
Other: 0 %
PLATELETS: 415 10*3/uL — AB (ref 150–400)
PROMYELOCYTES RELATIVE: 0 %
RBC: 3.61 MIL/uL — ABNORMAL LOW (ref 3.87–5.11)
RDW: 15.7 % — ABNORMAL HIGH (ref 11.5–15.5)
WBC: 38 10*3/uL — ABNORMAL HIGH (ref 4.0–10.5)

## 2018-07-23 LAB — CULTURE, BLOOD (ROUTINE X 2)
Culture: NO GROWTH
Culture: NO GROWTH

## 2018-07-23 LAB — PHOSPHORUS: PHOSPHORUS: 4.7 mg/dL — AB (ref 2.5–4.6)

## 2018-07-23 LAB — LACTATE DEHYDROGENASE: LDH: 1425 U/L — ABNORMAL HIGH (ref 98–192)

## 2018-07-23 LAB — MAGNESIUM: MAGNESIUM: 1.9 mg/dL (ref 1.7–2.4)

## 2018-07-23 MED ORDER — SODIUM CHLORIDE 0.9 % IV SOLN
INTRAVENOUS | Status: DC
  Administered 2018-07-23: 12:00:00 via INTRAVENOUS

## 2018-07-23 MED ORDER — ALBUMIN HUMAN 25 % IV SOLN
25.0000 g | Freq: Once | INTRAVENOUS | Status: AC
  Administered 2018-07-23: 25 g via INTRAVENOUS
  Filled 2018-07-23: qty 100

## 2018-07-23 MED ORDER — VANCOMYCIN HCL IN DEXTROSE 1-5 GM/200ML-% IV SOLN
1000.0000 mg | Freq: Once | INTRAVENOUS | Status: AC
  Administered 2018-07-23: 1000 mg via INTRAVENOUS
  Filled 2018-07-23: qty 200

## 2018-07-23 NOTE — Progress Notes (Signed)
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[place])    07/09/2018 -  Chemotherapy    The patient had DOXOrubicin (ADRIAMYCIN) chemo injection 130 mg, 75 mg/m2 = 130 mg (original dose ), Intravenous,  Once, 0 of 5 cycles Dose modification: 75 mg/m2 (Cycle 1, Reason: Other (see comments)) palonosetron (ALOXI) injection 0.25 mg, 0.25 mg, Intravenous,  Once, 0 of 5 cycles pegfilgrastim (NEULASTA ONPRO KIT) injection 6 mg, 6 mg, Subcutaneous, Once, 0 of 5 cycles vinCRIStine (ONCOVIN) 2 mg in sodium chloride 0.9 % 50 mL chemo infusion, 2 mg, Intravenous,  Once, 0 of 5 cycles cyclophosphamide (CYTOXAN) 2,080 mg in sodium chloride 0.9 % 500 mL chemo infusion, 1,200 mg/m2 = 2,080 mg (original dose ), Intravenous,  Once, 0 of 5 cycles Dose modification: 1,200 mg/m2 (Cycle 1) dexrazoxane (ZINECARD) 1,310 mg in lactated ringers 250 mL IVPB, 750 mg/m2 = 1,310 mg (original dose ), Intravenous,  Once, 0 of 5 cycles Dose modification: 750 mg/m2 (Cycle 1) fosaprepitant (EMEND) 150 mg, dexamethasone (DECADRON) 10 mg in sodium chloride 0.9 % 145 mL IVPB, , Intravenous,  Once, 0 of 5 cycles  for chemotherapy treatment.       INTERVAL HISTORY:  Krystal Baker 62 y.o. female returns for routine follow-up for mullerian tumor. Patient is here today with her family. She is experiencing extreme fatigue, increased interstitial edema, and decrease appetite. Patient sleeps most of her day. She gets up to urinate about 4-5 times daily with minimal output and about twice during the night. She  has not had a bowel movement since Saturday however she has only ate yogurt and minimal soft foods. Patient has gained 8 pounds since her initial visit with [place]. Patient has nausea throughout the day. She needs help with all her ADLs and activities.     REVIEW OF SYSTEMS:  Review of Systems  Constitutional: Positive for appetite change and fatigue.  HENT:   Positive for trouble swallowing.   Respiratory: Positive for shortness of breath.   Cardiovascular: Positive for leg swelling.  Gastrointestinal: Positive for constipation and nausea.  Neurological: Positive for dizziness.  Hematological: Bruises/bleeds easily.  All other systems reviewed and are negative.    PAST MEDICAL/SURGICAL HISTORY:  Past Medical History:  Diagnosis Date  . Broken shoulder    left  . [facility])    Past Surgical History:  Procedure Laterality Date  . ABDOMINAL HYSTERECTOMY    . BREAST BIOPSY    . CARPAL TUNNEL RELEASE    . COLOSTOMY Right   . PARTIAL COLECTOMY Left      SOCIAL HISTORY:  Social History   Socioeconomic History  . Marital status: Widowed    Spouse name: Not on file  . Number of children: 2  . Years of education: Not on file  . Highest education level: Not on file  Occupational History  . Not on file  Social Needs  . Financial resource strain: Not on file  . Food insecurity:    Worry: Not on file    Inability: Not on file  . Transportation needs:    Medical:  Not on file    Non-medical: Not on file  Tobacco Use  . Smoking status: Never Smoker  . Smokeless tobacco: Never Used  Substance and Sexual Activity  . Alcohol use: Not Currently  . Drug use: Never  . Sexual activity: Not Currently  Lifestyle  . Physical activity:    Days per week: Not on file    Minutes per session: Not on file  . Stress: Not on file  Relationships  . Social connections:    Talks on phone: Not on file    Gets together: Not on file    Attends religious service: Not on file    Active  member of club or organization: Not on file    Attends meetings of clubs or organizations: Not on file    Relationship status: Not on file  . Intimate partner violence:    Fear of current or ex partner: Not on file    Emotionally abused: Not on file    Physically abused: Not on file    Forced sexual activity: Not on file  Other Topics Concern  . Not on file  Social History Narrative  . Not on file    FAMILY HISTORY:  Family History  Problem Relation Age of Onset  . Heart attack Father   . Leukemia Sister     CURRENT MEDICATIONS:  Outpatient Encounter Medications as of 07/23/2018  Medication Sig  . apixaban (ELIQUIS) 5 MG TABS tablet Take 27m po bid for 7 days then 568mpo bid  . bumetanide (BUMEX) 1 MG tablet Take 1 tablet (1 mg total) by mouth daily as needed (swelling).  . cephALEXin (KEFLEX) 500 MG capsule Take 1 capsule (500 mg total) by mouth every 8 (eight) hours for 5 days.  . Marland Kitchenranisetron (SANCUSO) 3.1 MG/24HR Apply to skin starting 24 hours before chemotherapy. Remove after 7 days.  . Marland Kitchenoratadine (CLARITIN) 10 MG tablet Take 1 tablet (10 mg total) by mouth daily.  . Marland Kitchenorphine (MS CONTIN) 15 MG 12 hr tablet Take 15 mg by mouth every 12 (twelve) hours.   . Marland KitchenLANZapine (ZYPREXA) 5 MG tablet Take 5 mg by mouth 2 (two) times daily.   . Marland Kitchenmeprazole (PRILOSEC) 20 MG capsule Take by mouth.  . ondansetron (ZOFRAN ODT) 8 MG disintegrating tablet Take 1 tablet (8 mg total) by mouth every 8 (eight) hours as needed for nausea or vomiting.  . polyethylene glycol (MIRALAX / GLYCOLAX) packet Take 17 g by mouth daily as needed.   . potassium chloride 20 MEQ/15ML (10%) SOLN Take 15 mLs (20 mEq total) by mouth daily.  . prochlorperazine (COMPAZINE) 10 MG tablet Take by mouth.  . senna-docusate (SENOKOT-S) 8.6-50 MG tablet Take by mouth.  . sodium bicarbonate/sodium chloride SOLN Swish and spit 10 mL every 4 (four) hours while awake.  Dissolve 2 teaspoon in 1 quart of water.  . sucralfate  (CARAFATE) 1 GM/10ML suspension Take by mouth.  . tamsulosin (FLOMAX) 0.4 MG CAPS capsule Take 0.4 mg by mouth daily.  . traMADol (ULTRAM) 50 MG tablet Take 50 mg by mouth every 6 (six) hours as needed for moderate pain.    Facility-Administered Encounter Medications as of 07/23/2018  Medication  . 0.9 %  sodium chloride infusion  . [COMPLETED] albumin human 25 % solution 25 g    ALLERGIES:  No Known Allergies   PHYSICAL EXAM:  ECOG Performance status: 2  Vitals:   07/23/18 1047  BP: 107/60  Pulse: (!) 116  Resp: 20  Temp: (!Marland Kitchen  97.3 F (36.3 C)  SpO2: 95%   Filed Weights   07/23/18 1047  Weight: 168 lb 3.2 oz (76.3 kg)    Physical Exam  Constitutional: She is oriented to person, place, and time.  Cardiovascular: Normal rate, regular rhythm and normal heart sounds.  Pulmonary/Chest: Effort normal and breath sounds normal.  Abdominal: There is tenderness.  Neurological: She is alert and oriented to person, place, and time.  Skin: Skin is warm and dry.  Abdominal exam shows induration medial to the ostomy site where she had cellulitis.  It is slightly warm.   LABORATORY DATA:  I have reviewed the labs as listed.  CBC    Component Value Date/Time   WBC 38.0 (H) 07/23/2018 1017   RBC 3.61 (L) 07/23/2018 1017   HGB 10.2 (L) 07/23/2018 1017   HCT 31.6 (L) 07/23/2018 1017   PLT 415 (H) 07/23/2018 1017   MCV 87.5 07/23/2018 1017   MCH 28.3 07/23/2018 1017   MCHC 32.3 07/23/2018 1017   RDW 15.7 (H) 07/23/2018 1017   LYMPHSABS 0.4 (L) 07/23/2018 1017   MONOABS 2.7 (H) 07/23/2018 1017   EOSABS 0.0 07/23/2018 1017   BASOSABS 0.0 07/23/2018 1017   CMP Latest Ref Rng & Units 07/23/2018 07/21/2018 07/20/2018  Glucose 70 - 99 mg/dL 104(H) 82 85  BUN 8 - 23 mg/dL 42(H) 34(H) 34(H)  Creatinine 0.44 - 1.00 mg/dL 1.93(H) 1.47(H) 1.25(H)  Sodium 135 - 145 mmol/L 134(L) 138 138  Potassium 3.5 - 5.1 mmol/L 4.3 4.1 4.3  Chloride 98 - 111 mmol/L 104 108 107  CO2 22 - 32 mmol/L  18(L) 18(L) 18(L)  Calcium 8.9 - 10.3 mg/dL 9.1 8.6(L) 8.4(L)  Total Protein 6.5 - 8.1 g/dL 5.8(L) 5.9(L) -  Total Bilirubin 0.3 - 1.2 mg/dL 0.4 0.6 -  Alkaline Phos 38 - 126 U/L 92 57 -  AST 15 - 41 U/L 174(H) 162(H) -  ALT 0 - 44 U/L 14 10 -       DIAGNOSTIC IMAGING:  I have reviewed images of the CT scan dated 07/18/2018 and discussed with the patient.     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  CT abdomen pelvis consistent with enlarged appendix, free pelvic fluid and enlarged fibroid uterus.  She underwent laparoscopic appendectomy with large pelvic mass noted intraoperatively.  GYN oncology was consulted. -On 05/19/2018 she underwent exploratory laparotomy and resection of large pelvic mass, TAH/BSO, partial colectomy with ostomy creation, placement of bilateral ureteral stents.  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 -She was evaluated by Dr. Angelina Ok at [facility] and pathology review noted high-grade sarcomatoid malignant neoplasm with rhabdomyosarcomatous differentiation - CT CAP at cancer treatment centers of [place] in [place] showed high density pelvic cystic structures which could represent hemorrhage or recurrence as well as peritoneal implants/carcinomatosis. -She was evaluated on 07/06/2018 at [facility] by Dr. Manus Rudd.  A CT scan on 07/06/2018 showed significant interval increase in metastatic disease within the abdomen and pelvis compared to 06/29/2018.  Interval development of minimal left pleural effusion and left lung base atelectasis.  Multiple small bowel loops are encased by tumor which is likely producing some small  bowel obstruction although there  were no markedly dilated loops of small bowel. - She received first cycle of chemotherapy on 07/09/2018 with vincristine 2 mg flat dose, doxorubicin 75 mg/m bolus with dexrazoxane 750 mg/m and cyclophosphamide 1200 mg/m.  This is to be cycled every 3 weeks for a total of 6 cycles.  She had a right upper chest double-lumen catheter placed for chemotherapy administration. - She was admitted to the hospital from 8/17 through 07/21/2018 with cellulitis of the skin medial to the ostomy site.  She was treated with IV antibiotics.  She is currently on Keflex twice daily. - The cellulitis area is still slightly warm and indurated.  Erythema has improved.  I have given 1 dose of vancomycin today. - I have reviewed CT scan dated 07/18/2018 which showed pelvic mass measuring up to 22 cm occupying the entirety of pelvis and extending into the lower abdomen.  Extensive peritoneal carcinomatosis with moderate volume ascites.  Mild bilateral hydronephrosis. - We have given another dose of albumin today for her anasarca. -I plan to repeat a CT scan of her abdomen and pelvis next week.  If there is stable pelvic mass or slight improvement we will proceed with second cycle.  If there is substantial growth of the tumor next week, will consider discontinuing chemotherapy.  Today's elevated white count could be Neulasta effect versus leukemoid reaction from tumor.  2.  DVT: - She was found to have a nonocclusive thrombus in the right proximal femoral vein on 07/19/2018.  She is currently taking Eliquis.  No bleeding issues reported.  3.  Elevated creatinine: - Her creatinine has gone up to 1.9 today.  She was started on Bumex on 07/21/2018, in place of Lasix.  This could be contributing to it.  The rise can also be due to tumor pressing on the ureters.  There was mild hydronephrosis noted on the CT scan from recent hospitalization.      Orders placed this encounter:  Orders Placed This  Encounter  Procedures  . CT Abdomen Pelvis W Contrast  . CBC with Differential/Platelet  . Comprehensive metabolic panel  . Lactate dehydrogenase      Derek Jack, MD [place] [phone]

## 2018-07-23 NOTE — Progress Notes (Signed)
Vancomycin and albumin given today as ordered..  Patient tolerated it well without problems. Vitals stable and discharged home from clinic via wheelchair with family at her side. Follow up as scheduled.

## 2018-07-23 NOTE — Patient Instructions (Signed)
[facility] at [facility] Discharge Instructions     Thank you for choosing [facility] at [facility] to provide your oncology and hematology care.  To afford each patient quality time with our provider, please arrive at least 15 minutes before your scheduled appointment time.   If you have a lab appointment with the Cancer Center please come in thru the  Main Entrance and check in at the main information desk  You need to re-schedule your appointment should you arrive 10 or more minutes late.  We strive to give you quality time with our providers, and arriving late affects you and other patients whose appointments are after yours.  Also, if you no show three or more times for appointments you may be dismissed from the clinic at the providers discretion.     Again, thank you for choosing [facility].  Our hope is that these requests will decrease the amount of time that you wait before being seen by our physicians.       _____________________________________________________________  Should you have questions after your visit to [facility], please contact our office at [phone] between the hours of 8:00 a.m. and 4:30 p.m.  Voicemails left after 4:00 p.m. will not be returned until the following business day.  For prescription refill requests, have your pharmacy contact our office and allow 72 hours.    Cancer Center Support Programs:   > Cancer Support Group  2nd Tuesday of the month 1pm-2pm, Journey Room    

## 2018-07-23 NOTE — Assessment & Plan Note (Addendum)
1.  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  CT abdomen pelvis consistent with enlarged appendix, free pelvic fluid and enlarged fibroid uterus.  She underwent laparoscopic appendectomy with large pelvic mass noted intraoperatively.  GYN oncology was consulted. -On 05/19/2018 she underwent exploratory laparotomy and resection of large pelvic mass, TAH/BSO, partial colectomy with ostomy creation, placement of bilateral ureteral stents.  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 -She was evaluated by Dr. Angelina Ok at [facility] and pathology review noted high-grade sarcomatoid malignant neoplasm with rhabdomyosarcomatous differentiation - CT CAP at cancer treatment centers of [place] in [place] showed high density pelvic cystic structures which could represent hemorrhage or recurrence as well as peritoneal implants/carcinomatosis. -She was evaluated on 07/06/2018 at [facility] by Dr. Manus Rudd.  A CT scan on 07/06/2018 showed significant interval increase in metastatic disease within the abdomen and pelvis compared to 06/29/2018.  Interval development of minimal left pleural effusion and left lung base atelectasis.  Multiple small bowel loops are encased by tumor which is likely producing some small bowel obstruction although there were no markedly dilated loops of small bowel. - She received first cycle of chemotherapy on 07/09/2018 with vincristine 2 mg flat dose, doxorubicin 75 mg/m bolus with dexrazoxane 750 mg/m and cyclophosphamide 1200 mg/m.  This is to be cycled every 3 weeks for a total of 6 cycles.  She had a right upper chest double-lumen catheter placed for chemotherapy administration. - She was admitted to the hospital from  8/17 through 07/21/2018 with cellulitis of the skin medial to the ostomy site.  She was treated with IV antibiotics.  She is currently on Keflex twice daily. - The cellulitis area is still slightly warm and indurated.  Erythema has improved.  I have given 1 dose of vancomycin today. - I have reviewed CT scan dated 07/18/2018 which showed pelvic mass measuring up to 22 cm occupying the entirety of pelvis and extending into the lower abdomen.  Extensive peritoneal carcinomatosis with moderate volume ascites.  Mild bilateral hydronephrosis. - We have given another dose of albumin today for her anasarca. -I plan to repeat a CT scan of her abdomen and pelvis next week.  If there is stable pelvic mass or slight improvement we will proceed with second cycle.  If there is substantial growth of the tumor next week, will consider discontinuing chemotherapy.  Today's elevated white count could be Neulasta effect versus leukemoid reaction from tumor.  2.  DVT: - She was found to have a nonocclusive thrombus in the right proximal femoral vein on 07/19/2018.  She is currently taking Eliquis.  No bleeding issues reported.  3.  Elevated creatinine: - Her creatinine has gone up to 1.9 today.  She was started on Bumex on 07/21/2018, in place of Lasix.  This could be contributing to it.  The rise can also be due to tumor pressing on the ureters.  There was mild hydronephrosis noted on the CT scan from recent hospitalization.

## 2018-07-24 ENCOUNTER — Telehealth (HOSPITAL_COMMUNITY): Payer: Self-pay | Admitting: Hematology

## 2018-07-24 NOTE — Telephone Encounter (Signed)
[place] VG#[id number] [phone]

## 2018-07-27 ENCOUNTER — Other Ambulatory Visit (HOSPITAL_COMMUNITY): Payer: Self-pay | Admitting: Nurse Practitioner

## 2018-07-27 ENCOUNTER — Telehealth (HOSPITAL_COMMUNITY): Payer: Self-pay | Admitting: *Deleted

## 2018-07-27 DIAGNOSIS — G4720 Circadian rhythm sleep disorder, unspecified type: Secondary | ICD-10-CM

## 2018-07-27 DIAGNOSIS — C549 Malignant neoplasm of corpus uteri, unspecified: Secondary | ICD-10-CM

## 2018-07-27 DIAGNOSIS — F518 Other sleep disorders not due to a substance or known physiological condition: Secondary | ICD-10-CM

## 2018-07-27 DIAGNOSIS — B379 Candidiasis, unspecified: Secondary | ICD-10-CM

## 2018-07-27 MED ORDER — FLUCONAZOLE 150 MG PO TABS: 150.0000 mg | ORAL_TABLET | Freq: Every day | ORAL | 0 refills | Status: AC

## 2018-07-27 MED ORDER — TEMAZEPAM 15 MG PO CAPS: 15.0000 mg | ORAL_CAPSULE | Freq: Every evening | ORAL | 0 refills | Status: AC | PRN

## 2018-07-27 NOTE — Telephone Encounter (Signed)
-----   Message from Glennie Isle, NP-C sent at 07/27/2018  5:17 PM EDT ----- I sent her diflucan and restoril  ----- Message ----- From: Farley Ly, LPN Sent: [birth year]1979  11:49 AM EDT To: Glennie Isle, NP-C  Pt's daughter called stating that the pt has a yeast infection from recent antibiotics and needs a RX for this please. She also stated that the pt is very restless at night and is not sleeping and wanted to know if the pt could be given something for that. Please let me know what he decides.  Thanks

## 2018-07-27 NOTE — Telephone Encounter (Signed)
Pt's daughter aware that Diflucan and Restoril have been called in for her mom. She verbalized understanding.

## 2018-07-29 ENCOUNTER — Ambulatory Visit (HOSPITAL_COMMUNITY): Admission: RE | Admit: 2018-07-29 | Payer: BLUE CROSS/BLUE SHIELD | Source: Ambulatory Visit

## 2018-07-29 ENCOUNTER — Encounter (HOSPITAL_COMMUNITY): Payer: Self-pay | Admitting: *Deleted

## 2018-07-29 NOTE — Progress Notes (Signed)
Daughter called clinic this morning and spoke with our receptionist to notify that patient is going to go with hospice.   I called and spoke with Krystal Baker, patient's daughter, and she states that her mom has had a rough past few days with each day getting worse. She has been unable to sleep, been up and down all night. She has had prolonged periods of apnea with her sats dropping into the 80's and 70's.  She is no longer lucid and you can't understanding what she is saying.  Krystal Baker states that they were going to bring her mom for the CT scan this morning because that's what Krystal Baker wanted to be able to ease her mind and give her some peace about her disease.  Krystal Baker states that they weren't even able to get her mom out of bed this morning to get her here for the CT scan.  She states that she wouldn't be able to tolerate another round of chemotherapy even if the cancer was better.  She states that palliative hospice is coming out to their home today to see Krystal Baker so she wants to cancel all appointments with our office.  I explained to her that we were here for her and the family if they should need anything further.  She verbalizes understanding and expresses her appreciation for everything.

## 2018-07-30 ENCOUNTER — Ambulatory Visit (HOSPITAL_COMMUNITY): Payer: BLUE CROSS/BLUE SHIELD | Admitting: Hematology

## 2018-07-30 ENCOUNTER — Ambulatory Visit (HOSPITAL_COMMUNITY): Payer: BLUE CROSS/BLUE SHIELD

## 2018-07-30 ENCOUNTER — Other Ambulatory Visit (HOSPITAL_COMMUNITY): Payer: BLUE CROSS/BLUE SHIELD

## 2018-07-30 ENCOUNTER — Encounter (HOSPITAL_COMMUNITY): Payer: Self-pay

## 2018-08-02 NOTE — Patient Instructions (Signed)
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  You will be treated with Cytoxan (cyclophosphamide), Zinecard (dexrazoxane), Oncovin (vincristine), Adriamycin (doxorubicin) and Neulasta (pegfilgrastim).  This will be given every 21 days.  You will see the doctor regularly throughout treatment.  We monitor your lab work prior to every treatment. The doctor monitors your response to treatment by the way you are feeling, your blood work, and scans periodically.  There will be wait times while you are here for treatment.  It will take about 30 minutes to 1 hour for your lab work to result.  Then there will be wait times while pharmacy mixes your medications.   You will be given the following premedications prior to each treatment: Aloxi and Emend:  High powered antinausea medications to prevent nausea induced by chemotherapy   Cyclophosphamide (Cytoxan)  About This Drug Cyclophosphamide is used to treat cancer. It is given orally (by mouth) and in the vein (IV).  Possible Side Effects  A decrease in the number of white blood cells which may raise your risk of infection  Fever and fever in the setting of decreased white blood cells, which is a serious condition that can be life-threatening  Infection. Severe infections, including viral, bacterial and fungal, which can be life-threatening.  Nausea and vomiting (throwing up)  Diarrhea (loose bowel movements)  Hair loss. Hair loss is often temporary, although with certain medicine, hair loss can sometimes be permanent. Hair loss may happen suddenly or gradually. If you lose hair, you may lose it from your head, face, armpits, pubic area, chest, and/or legs. You may also notice your hair getting thin.  Note: Not all possible side effects are included above.  Warnings and Precautions  Severe bone marrow suppression, which can be  life-threatening. This is a decrease in the number of white blood cells, red blood cells, and platelets. This may raise your risk of infection, make you tired and weak (fatigue), and raise your risk of bleeding.  Abnormal heart beat and/or changes in the tissue of the heart, which can be life-threatening. Some changes may happen that can cause your heart to have less ability to pump blood.  Effects on the bladder and kidneys that may be life-threatening. This drug may cause inflammation (swelling), irritation and bleeding in the bladder and/or kidneys. You may have blood in your urine.  Changes in your liver function and blockage of small veins in the liver, which can cause liver failure and be life-threatening.  Inflammation (swelling) of the lungs, and changes to the small vessels of your lungs. You may have a dry cough or trouble breathing.  This drug may cause slow wound healing  Electrolyte changes, especially a decrease in sodium which can be life-threatening  This drug may raise your risk of getting a second cancer  These side effects may be more severe if you are receiving high doses of this medication included in pre-transplant chemotherapy.  Note: Some of the side effects above are very rare. If you have concerns and/or questions, please discuss them with your medical team.  Important Information  Your doctor may prescribe you to drink extra fluids after your treatment to flush your bladder and urinate often to help decrease the risk of the effects on your bladder.  If you must have emergency surgery or have an accident that results in a wound, tell the doctor that you are on  cyclophosphamide.  How to Take Your Medication  For Oral Only: Swallow the medicine whole with or without food. Do not chew, break or crush it. Do not touch a broken or crushed tablet. Do not take the medicine at bedtime.  Missed dose: If you miss a dose, do not take 2 doses at the same time or  extra doses. Call your doctor or nurse for further instructions.  Handling: Wash your hands after handling your medicine, your caretakers should not handle your medicine with bare hands and should wear latex gloves.  This drug may be present in the saliva, tears, sweat, urine, stool, vomit, semen, and vaginal secretions. Talk to your doctor and/or your nurse about the necessary precautions to take during this time.  If you get any of the content of a broken capsules on your skin, you should wash the area of the skin well with soap and water right away. Call your doctor if you get a skin reaction.  Storage: Store this medicine in the original container at room temperature. Do not store at temperature above 30C (33F).  Disposal of unused oral medicine: Do not flush any expired and/or unused medicine down the toilet or drain unless you are specifically instructed to do so on the medication label. Some facilities have take-back programs and/or other options. If you do not have a take-back program in your area, then please discuss with your nurse or your doctor how to dispose of unused medicine.  Treating Side Effects  Manage tiredness by pacing your activities for the day.  Be sure to include periods of rest between energy-draining activities.  To decrease the risk of infection, wash your hands regularly.  Avoid close contact with people who have a cold, the flu, or other infections.  Take your temperature as your doctor or nurse tells you, and whenever you feel like you may have a fever.  To help decrease the risk of bleeding, use a soft toothbrush. Check with your nurse before using dental floss.  Be very careful when using knives or tools.  Use an electric shaver instead of a razor.  Keeping your pain under control is important to your well-being. Please tell your doctor or nurse if you are experiencing pain.  Drink plenty of fluids (a minimum of eight glasses per day is  recommended).  If you throw up or have loose bowel movements, you should drink more fluids so that you do not become dehydrated (lack of water in the body from losing too much fluid).  If you have diarrhea, eat low-fiber foods that are high in protein and calories and avoid foods that can irritate your digestive tracts or lead to cramping.  Ask your nurse or doctor about medicine that can lessen or stop your diarrhea.  To help with nausea and vomiting, eat small, frequent meals instead of three large meals a day. Choose foods and drinks that are at room temperature. Ask your nurse or doctor about other helpful tips and medicine that is available to help stop or lessen these symptoms.  To help with hair loss, wash with a mild shampoo and avoid washing your hair every day.  Avoid rubbing your scalp, pat your hair or scalp dry.  Avoid coloring your hair.  Limit your use of hair spray, electric curlers, blow dryers, and curling irons.  If you are interested in getting a wig, talk to your nurse. You can also call the [facility] at 800-ACS-2345 to find out information about the  Look Good, Feel Better program close to where you live. It is a free program where women getting chemotherapy can learn about wigs, turbans and scarves as well as makeup techniques and skin and nail care.  Food and Drug Interactions  There are no known interactions of cyclophosphamide with food.  Check with your doctor or pharmacist about all other prescription medicines and over-the-counter medicines and dietary supplements (vitamins, minerals, herbs and others) you are taking before starting this medicine as there are known drug interactions with cyclophosphamide Also, check with your doctor or pharmacist before starting any new prescription or over-the-counter medicines, or dietary supplement to make sure that there are no interactions.  When to Call the Doctor Call your doctor or nurse if you  have any of these symptoms and/or any new or unusual symptoms:  Fever of 100.4 F (38 C) or higher  Chills  Tiredness that interferes with your daily activities  Feeling dizzy or lightheaded  Easy bleeding or bruising  Dry cough  Trouble breathing  Swelling of arms, hands, legs, and/or feet  Weight gain of 5 pounds in one week (fluid retention)  Feeling that your heart is beating in a fast or not normal way (palpitations)  Pain in your chest  Nausea that stops you from eating or drinking and/or is not relieved by prescribed medicines  Throwing up more than 3 times a day  Diarrhea, 4 times in one day or diarrhea with lack of strength or a feeling of being dizzy  Decreased urine, or very dark urine  Pain when passing urine; blood in urine  Decreased urine or difficulty urinating  Signs of possible liver problems: dark urine, pale bowel movements, bad stomach pain, feeling very tired and weak, unusual itching, or yellowing of the eyes or skin  If you think you may be pregnant or may have impregnated your partner  Reproduction Warnings  Pregnancy warning: This drug can have harmful effects on the unborn baby. Women of childbearing potential should use highly effective methods of birth control during your cancer treatment and for 1 year after treatment. Men with female partners who are pregnant or may become pregnant should use a condom during your cancer treatment and for at least 4 months after your cancer treatment. Let your doctor know right away if you think you may be pregnant or may have impregnated your partner.  In women, menstrual bleeding may become irregular or stop while you are getting this drug. Do not assume that you cannot become pregnant if you do not have a menstrual period.  Breastfeeding warning: This drug passes into breast milk. For this reason, women should talk to their doctor about the risks and benefits of breastfeeding during treatment  with this drug because this drug may enter the breast milk and cause harm to a breastfeeding baby.  Fertility warning: In men and women both, this drug may affect your ability to have children in the future. Talk with your doctor or nurse if you plan to have children. Ask for information on sperm or egg banking.  Doxorubicin (Adriamycin)  About This Drug Doxorubicin is used to treat cancer. It is given in the vein (IV).  Possible Side Effects  Bone marrow suppression. This is a decrease in the number of white blood cells, red blood cells, and platelets. This may raise your risk of infection, make you tired and weak (fatigue), and raise your risk of bleeding.  Fever in the setting of decreased white blood cells, which is  a serious condition that can be lifethreatening  Blurred vision or other changes in eyesight  Nausea and vomiting (throwing up)  Diarrhea (loose bowel movements)  Decreased appetite (decreased hunger)  Soreness of the mouth and throat. You may have red areas, white patches, or sores that hurt.  Pain in your abdomen  Tiredness and weakness  General discomfort, a feeling of being unwell  Allergic reactions, including anaphylaxis are rare but may happen in some patients. Signs of allergic reaction to this drug may be swelling of the face, feeling like your tongue or throat are swelling, trouble breathing, rash, itching, fever, chills, feeling dizzy, and/or feeling that your heart is beating in a fast or not normal way. If this happens, do not take another dose of this drug. You should get urgent medical treatment.  In women, menstrual bleeding may become irregular or stop while you are getting this drug. Do not assume that you cannot become pregnant if you do not have a menstrual period.  Women may go through signs of menopause (change of life) like vaginal dryness or itching.  Hair loss. Hair loss is often temporary, although with certain medicine, hair  loss can sometimes be permanent. Hair loss may happen suddenly or gradually. If you lose hair, you may lose it from your head, face, armpits, pubic area, chest, and/or legs. You may also notice your hair getting thin.  Changes in your nail color, nail loss and/or brittle nail  If you have received radiation treatments, your skin may become red after doxorubicin. This reaction is called radiation recall. Your body is recalling, or remembering, that it had radiation therapy.  Note: Not all possible side effects are included above.  Warnings and Precautions  Skin and tissue irritation including redness, pain, warmth, or swelling at the IV site if the drug leaks out of the vein and into nearby tissue. Very rarely it may cause tissue necrosis (death).  Changes in the tissue of the heart and/or congestive heart failure. You may be short of breath. Your arms, hands, legs and feet may swell.  This drug may raise your risk of getting a second cancer, such as leukemia and myelodysplastic syndrome  Severe bone marrow suppression  Note: Some of the side effects above are very rare. If you have concerns and/or questions, please discuss them with your medical team.  Important Information  This drug may be present in the saliva, tears, sweat, urine, stool, vomit, semen, and vaginal secretions. Talk to your doctor and/or your nurse about the necessary precautions to take during this time.  Talk to your doctor before receiving any vaccinations during your treatment. Some vaccinations are not recommended while receiving doxorubicin.  Urine color may be slightly orange or reddish starting several hours after you get this drug. This will slowly go away within one to two days.  Treating Side Effects  Manage tiredness by pacing your activities for the day.  Be sure to include periods of rest between energy-draining activities.  To decrease the risk of infection, wash your hands  regularly.  Avoid close contact with people who have a cold, the flu, or other infections.  Take your temperature as your doctor or nurse tells you, and whenever you feel like you may have a fever.  To help decrease the risk of bleeding, use a soft toothbrush. Check with your nurse before using dental floss.  Be very careful when using knives or tools.  Use an electric shaver instead of a razor.  Drink  plenty of fluids (a minimum of eight glasses per day is recommended).  If you throw up or have loose bowel movements, you should drink more fluids so that you do not become dehydrated (lack of water in the body from losing too much fluid).  If you have diarrhea, eat low-fiber foods that are high in protein and calories and avoid foods that can irritate your digestive tracts or lead to cramping.  Ask your nurse or doctor about medicine that can lessen or stop your diarrhea.  To help with nausea and vomiting, eat small, frequent meals instead of three large meals a day. Choose foods and drinks that are at room temperature. Ask your nurse or doctor about other helpful tips and medicine that is available to help stop or lessen these symptoms.  Mouth care is very important. Your mouth care should consist of routine, gentle cleaning of your teeth or dentures and rinsing your mouth with a mixture of 1/2 teaspoon of salt in 8 ounces of water or 1/2 teaspoon of baking soda in 8 ounces of water. This should be done at least after each meal and at bedtime.  If you have mouth sores, avoid mouthwash that has alcohol. Also avoid alcohol and smoking because they can bother your mouth and throat.  To help with decreased appetite, eat small, frequent meals.  Eat foods high in calories and protein, such as meat, poultry, fish, dry beans, tofu, eggs, nuts, milk, yogurt, cheese, ice cream, pudding, and nutritional supplements.  Consider using sauces and spices to increase taste. Daily exercise,  with your doctors approval, may increase your appetite.  Keeping your pain under control is important to your well-being. Please tell your doctor or nurse if you are experiencing pain.  To help with possible signs of early menopause, vaginal lubricants can be used to lessen vaginal dryness, itching, and pain during sexual relations.  To help with hair loss, wash with a mild shampoo and avoid washing your hair every day.  Avoid rubbing your scalp, pat your hair or scalp dry.  Avoid coloring your hair.  Limit your use of hair spray, electric curlers, blow dryers, and curling irons.  If you are interested in getting a wig, talk to your nurse. You can also call the [facility] at 800-ACS-2345 to find out information about the Look Good, Feel Better program close to where you live. It is a free program where women getting chemotherapy can learn about wigs, turbans and scarves as well as makeup techniques and skin and nail care.  Keeping your nails moisturized may help with brittleness.  If you received radiation, and your skin becomes red or irritated again, follow the same care instructions you did during radiation treatment. Be sure to tell the nurse or doctor administering your chemotherapy about your skin changes. Food and Drug Interactions  There are no known interactions of doxorubicin with food.  Check with your doctor or pharmacist about all other prescription medicines and over-the-counter medicines and dietary supplements (vitamins, minerals, herbs and others) you are taking before starting this medicine as there are known drug interactions with doxorubicin. Also, check with your doctor or pharmacist before starting any new prescription or over-the-counter medicines, or dietary supplements to make sure that there are no interactions.  When to Call the Doctor Call your doctor or nurse if you have any of these symptoms and/or any new or unusual symptoms:   Fever of 100.4 F (38 C) or higher  Chills  Blurred vision  or other changes in eyesight  Feeling dizzy or lightheaded  Trouble breathing  Feeling that your heart is beating fast or in a not normal way (palpitations)  Tiredness that interferes with your daily activities  Easy bleeding or bruising  Loose bowel movements (diarrhea) 4 times a day or loose bowel movements with lack of strength or a feeling of being dizzy  Pain in your mouth or throat that makes it hard to eat or drink  Pain in your abdomen that does not go away  Nausea that stops you from eating or drinking and/or is not relieved by prescribed medicines  Throwing up more than 3 times a day  Lasting loss of appetite or rapid weight loss of five pounds in a week  Swelling of legs, ankles, or feet  Signs of inflammation/infection (redness, swelling, pain) of the tissue around your nails.  Weight gain of 5 pounds in one week (fluid retention)  Signs of allergic reaction: swelling of the face, feeling like your tongue or throat are swelling, trouble breathing, rash, itching, fever, chills, feeling dizzy, and/or feeling that your heart is beating in a fast or not normal way. If this happens, call 911 for emergency care.  While you are getting this drug, please tell your nurse right away if you have any pain, redness, or swelling at the site of the IV infusion  If you think you may be pregnant or may have impregnated your partner  Reproduction Warnings  Pregnancy warning: This drug can have harmful effects on the unborn baby. Women of childbearing potential and men with female partners of childbearing potential should use effective methods of birth control during your cancer treatment. Let your doctor know right away if you think you may be pregnant or may have impregnated your partner  Breastfeeding warning: Women should not breastfeed during treatment because this drug could enter the breast milk and cause  harm to a breastfeeding baby. Women should talk to their doctor about the risks and benefits of breastfeeding during treatment with this drug.  Fertility warning: In men and women both, this drug may affect your ability to have children in the future. Talk with your doctor or nurse if you plan to have children. Ask for information on sperm or egg banking.  Dexrazoxane (Zinecard)  About This Drug Dexrazoxane is used to protect your heart when you get the certain chemotherapy drugs It is given in the vein (IV).  Possible Side Effects  Pain at the site of your injection  Note: Not all possible side effects are included above.  Warnings and Precautions  Bone marrow suppression. This is a decrease in the number of white blood cells, red blood cells, and platelets. This may raise your risk of infection, make you tired and weak (fatigue), and raise your risk of bleeding.  This drug may raise your risk of getting a second cancer.  Treating Side Effects  Manage tiredness by pacing your activities for the day.  Be sure to include periods of rest between energy-draining activities.  To decrease the risk of infection, wash your hands regularly.  Avoid close contact with people who have a cold, the flu, or other infections  Take your temperature as your doctor or nurse tells you, and whenever you feel like you may have a fever.  To help decrease the risk of bleeding, use a soft toothbrush. Check with your nurse before using dental floss.  Be very careful when using knives or tools.  Use an electric  shaver instead of a razor.  Food and Drug Interactions  There are no known interactions of dexrazoxane with food.  This drug may interact with other medicines. Tell your doctor and pharmacist about all the prescription and over-the-counter medicines and dietary supplements (vitamins, minerals, herbs and others) that you are taking at this time. Also, check with your doctor or  pharmacist before starting any new prescription or over-the-counter medicines, or dietary supplements to make sure that there are no interactions.  When to Call the Doctor Call your doctor or nurse if you have any of these symptoms and/or any new or unusual symptoms:  Fever of 100.4 F (38 C) or higher  Chills  Tiredness that interferes with your daily activities  Feeling dizzy or lightheaded  Easy bleeding or bruising  Pain at the site of injection  If you think you may be pregnant or may have impregnated your partner  Reproduction Warnings  Pregnancy warning: This drug can have harmful effects on the unborn baby. Women of childbearing potential should use highly effective methods of birth control during your cancer treatment. Let your doctor know right away if you think you may be pregnant  Breastfeeding warning: It is not known if this drug passes into breast milk. For this reason, women should not breastfeed during treatment with this drug because this drug may enter the breast milk and cause harm to a breastfeeding baby.  Fertility warning: Human fertility studies have not been done with this drug. Talk with your doctor or nurse if you plan to have children. Ask for information on sperm or egg banking.  Vincristine (Oncovin)  About This Drug Vincristine is used to treat cancer. It is given in the vein (IV)  Possible Side Effects  Changes to your heart function including heart attack may rarely occur  Nausea and vomiting (throwing up)  Diarrhea (loose bowel movements)  Constipation (not able to move bowels)  Weight loss  Swelling of legs, ankles and/or feet  Feeling dizzy  Headache  Pain in your abdomen  Effects on the nerves are called peripheral neuropathy. You may feel numbness, tingling, or pain in your hands and feet. It may be hard for you to button your clothes, open jars, or walk as usual. The effect on the nerves may get worse with more  doses of the drug. These effects get better in some people after the drug is stopped but it does not get better in all people.  Hair loss. Hair loss is often temporary, although with certain medicine, hair loss can sometimes be permanent. Hair loss may happen suddenly or gradually. If you lose hair, you may lose it from your head, face, armpits, pubic area, chest, and/or legs. You may also notice your hair getting thin.  Changes in blood pressure  Note: Not all possible side effects are included above.  Warnings and Precautions  Skin and tissue irritation including redness, pain, warmth, or swelling at the IV site if the drug leaks out of the vein and into nearby tissue. Very rarely, it may cause tissue necrosis (death).  Tumor lysis syndrome: This drug may act on the cancer cells very quickly. This may affect how your kidneys work.  Allergic reactions, including anaphylaxis are rare but may happen in some patients. Signs of allergic reaction to this drug may be swelling of the face, feeling like your tongue or throat are swelling, trouble breathing, rash, itching, fever, chills, feeling dizzy, and/or feeling that your heart is beating in a fast  or not normal way. If this happens, do not take another dose of this drug. You should get urgent medical treatment.  Changes in your central nervous system can happen. You may have numbness or tingling, also described above as neuropathy. With more doses this may cause problems with balance and walking. Rarely, it may cause paralysis. If you start to have any of these symptoms let your doctor know right away.  Constipation may be severe causing intestinal blockage or perforation.  Trouble breathing or severe spasm of airway may occur following infusion of vincristine usually in combination with another drug called mitomycin.  Note: Some of the side effects above are very rare. If you have concerns and/or questions, please discuss them with  your medical team.  Important Information  This drug may be present in the saliva, tears, sweat, urine, stool, vomit, semen, and vaginal secretions. Talk to your doctor and/or your nurse about the necessary precautions to take during this time.  Treating Side Effects  Drink plenty of fluids (a minimum of eight glasses per day is recommended).  If you throw up or have loose bowel movements, you should drink more fluids so that you do not become dehydrated (lack of water in the body from losing too much fluid).  If you have diarrhea, eat low-fiber foods that are high in protein and calories and avoid foods that can irritate your digestive tracts or lead to cramping.  To help with nausea and vomiting, eat small, frequent meals instead of three large meals a day. Choose foods and drinks that are at room temperature. Ask your nurse or doctor about other helpful tips and medicine that is available to help stop or lessen these symptoms.  Ask your doctor or nurse about medicines that are available to help stop or lessen constipation and/ or diarrhea.  If you are not able to move your bowels, check with your doctor or nurse before you use enemas, laxatives, or suppositories.  To help decrease weight loss, drink fluids that contribute calories (whole milk, juice, soft drinks, sweetened beverages, milkshakes, and nutritional supplements) instead of water.  Include a source of protein at every meal and snack, such as meat, poultry, fish, dry beans, tofu, eggs, nuts, milk, yogurt, cheese, ice cream, pudding, and nutritional supplements.  If you are dizzy, get up slowly after sitting or lying.  If you have numbness and tingling in your hands and feet, be careful when cooking, walking, and handling sharp objects and hot liquids.  Keeping your pain under control is important to your well-being. Please tell your doctor or nurse if you are experiencing pain.  To help with hair loss, wash with  a mild shampoo and avoid washing your hair every day.  Avoid rubbing your scalp, pat your hair or scalp dry.  Avoid coloring your hair.  Limit your use of hair spray, electric curlers, blow dryers, and curling irons.  If you are interested in getting a wig, talk to your nurse. You can also call the [facility] [facility] at [phone] to find out information about the Look Good, Feel Better program close to where you live. It is a free program where women getting chemotherapy can learn about wigs, turbans and scarves as well as makeup techniques and skin and nail care.  Food and Drug Interactions  There are no known interactions of vincristine with food.  Check with your doctor or pharmacist about all other prescription medicines and dietary supplements you are taking before starting this medicine  as there are known drug interactions with vincristine. Also, check with your doctor or pharmacist before starting any new prescription or overthe- counter medicines, or dietary supplement to make sure that there are no interactions.  When to Call the Doctor Call your doctor or nurse if you have any of these symptoms and/or any new or unusual symptoms:  Fever of 100.4 F (38 C) or higher  Chills  A headache that does not go away  Blurry vision or other changes in eyesight  Lightheadedness, or dizziness  Confusion and/or agitation  Seizures  Chest pain or symptoms of a heart attack. Most heart attacks involve pain in the center of the chest that lasts more than a few minutes. The pain may go away and come back or it can be constant. It can feel like pressure, squeezing, fullness, or pain. Sometimes pain is felt in one or both arms, the back, neck, jaw, or stomach. If any of these symptoms last 2 minutes, call 911.  Trouble breathing  Nausea that stops you from eating or drinking and/or is not relieved by prescribed medicines  Throwing up more than 3 times a day  Pain in  your abdomen that does not go away  No bowel movement in 3 days or when you feel uncomfortable  Diarrhea, 4 times in one day or diarrhea with lack of strength or a feeling of being dizzy  Lasting loss of appetite or rapid weight loss of five pounds in a week  While you are getting this drug, please tell your nurse right away if you have any pain, redness, or swelling at the site of the IV infusion  Numbness, tingling, or pain in your hands and feet  Problems with balance or walking  Inability to urinate  Signs of allergic reaction: swelling of the face, feeling like your tongue or throat are swelling, trouble breathing, rash, itching, fever, chills, feeling dizzy, and/or feeling that your heart is beating in a fast or not normal way. If this happens, call 911 for emergency care.  Signs of tumor lysis: Confusion or agitation, decreased urine, nausea/vomiting, diarrhea, muscle cramping, numbness and/or tingling, seizures.  If you think you may be pregnant  Reproduction Warnings  Pregnancy warning: This drug can have harmful effects on the unborn baby. Women of child bearing potential should use effective methods of birth control during your cancer treatment. Let your doctor know right away if you think you may be pregnant.  Breastfeeding warning: It is not known if this drug passes into breast milk. For this reason, women should talk to their doctor about the risks and benefits of breastfeeding during treatment with this drug because this drug may enter the breast milk and cause harm to a breastfeeding baby.  Fertility warning: Human fertility studies have not been done with this drug. Talk with your doctor or nurse if you plan to have children. Ask for information on sperm or egg banking. Revised March 2019  Pegfilgrastim-xxxx (Neulasta, Neulasta Onpro, [place], Udenyca)  About This Drug Pegfilgrastim-xxxx belongs to a class of medicines called granulocyte  colony-stimulating factor (G-CSF). G-CSF helps the body make more white blood cells. White blood cells help fight infection in your body. This drug is given as an injection under the skin (subcutaneously).  Possible Side Effects  Bone pain  Pain in your arms and/or legs  Note: Each of the side effects above was reported in 5% or greater of patients treated with pegfilgrastim-xxxx. Not all possible side effects are included  above.  Warnings and Precautions  Enlargement and inflammation (swelling) of your spleen, which can very rarely rupture and be lifethreatening. Signs of enlargement may be left-sided pain in your abdomen and/or shoulder.  Trouble breathing because of fluid build-up around your lungs and/or inflammation (swelling) of the lungs.  Allergic reactions, including anaphylaxis are rare but may happen in some patients. Signs of allergic reaction to this drug may be swelling of the face, feeling like your tongue or throat are swelling, trouble breathing, rash, itching, fever, chills, feeling dizzy, and/or feeling that your heart is beating in a fast or not normal way. If this happens, do not take another dose of this drug. You should get urgent medical treatment.  Sickle cell crisis in sickle cell patients treated with pegfilgrastim-xxxx  Changes in your kidney function  A rapid increase in your white blood cells may happen  A syndrome where fluid and protein can leak from your blood vessels into your tissues. This can cause a decrease in your blood protein level and blood pressure and fluid can accumulate in your tissues and/or lungs.  The on-body injector uses acrylic adhesive. Caution should be used if you have an allergy to acrylic adhesives.  Missed or partial doses of the medication have been reported from the on-body injector device not working properly. This could increase your risk of developing severely low white blood cells, which puts you at a risk of  severe and life-threatening infections. If you have any problems with the onbody injector, contact your doctor and/or nurse right away.  Inflammation of the aorta- symptoms may include fever, abdominal pain, back pain and feeling tired.  Note: Some of the side effects above are very rare. If you have concerns and/or questions, please discuss them with your medical team.  Important Information  If you have the on-body injector (OBI) placed, avoid activities such as traveling, driving, or operating heavy machinery during hours 26-29 after it is placed.  How to Take Your Medication  Talk to your doctor, nurse and/or pharmacist for proper preparation, dosing, administration. It is important that you follow the instructions and precautions closely.  Treating Side Effects  Keeping your pain under control is important to your well-being. Please tell your doctor or nurse if you are experiencing pain.  Food and Drug Interactions  There are no known interactions of pegfilgrastim-xxxx with food.  Tell your doctor and pharmacist about all the prescription and over-the-counter medicines and dietary supplements (vitamins, minerals, herbs and others) that you are taking at this time. Also, check with your doctor or pharmacist before starting any new prescription or over-the-counter medicines, or dietary supplements to make sure that there are no interactions.  When to Call the Doctor Call your doctor or nurse if you have any of these symptoms and/or any new or unusual symptoms:  Fever of 100.4 F (38 C) or higher  Chills  Cough  Wheezing and/or trouble breathing  Feeling dizzy or lightheaded  Tiredness that interferes with your daily activities  Pain in the left side of the abdomen and/or shoulder pain   SELF CARE ACTIVITIES WHILE ON CHEMOTHERAPY:  Hydration Increase your fluid intake 48 hours prior to treatment and drink at least 8 to 12 cups (64 ounces) of  water/decaffeinated beverages per day after treatment. You can still have your cup of coffee or soda but these beverages do not count as part of your 8 to 12 cups that you need to drink daily. No alcohol intake.  Medications Continue  taking your normal prescription medication as prescribed.  If you start any new herbal or new supplements please let us know first to make sure it is safe.  Mouth Care Have teeth cleaned professionally before starting treatment. Keep dentures and partial plates clean. Use soft toothbrush and do not use mouthwashes that contain alcohol. Biotene is a good mouthwash that is available at most pharmacies or may be ordered by calling [phone]. Use warm salt water gargles (1 teaspoon salt per 1 quart warm water) before and after meals and at bedtime. Or you may rinse with 2 tablespoons of three-percent hydrogen peroxide mixed in eight ounces of water. If you are still having problems with your mouth or sores in your mouth please call the clinic. If you need dental work, please let the doctor know before you go for your appointment so that we can coordinate the best possible time for you in regards to your chemo regimen. You need to also let your dentist know that you are actively taking chemo. We may need to do labs prior to your dental appointment.  Skin Care Always use sunscreen that has not expired and with SPF (Sun Protection Factor) of 50 or higher. Wear hats to protect your head from the sun. Remember to use sunscreen on your hands, ears, face, & feet.  Use good moisturizing lotions such as udder cream, eucerin, or even Vaseline. Some chemotherapies can cause dry skin, color changes in your skin and nails.     Avoid long, hot showers or baths.  Use gentle, fragrance-free soaps and laundry detergent.  Use moisturizers, preferably creams or ointments rather than lotions because the thicker consistency is better at preventing skin dehydration. Apply the cream or  ointment within 15 minutes of showering. Reapply moisturizer at night, and moisturize your hands every time after you wash them.  Hair Loss (if your doctor says your hair will fall out)   If your doctor says that your hair is likely to fall out, decide before you begin chemo whether you want to wear a wig. You may want to shop before treatment to match your hair color.  Hats, turbans, and scarves can also camouflage hair loss, although some people prefer to leave their heads uncovered. If you go bare-headed outdoors, be sure to use sunscreen on your scalp.  Cut your hair short. It eases the inconvenience of shedding lots of hair, but it also can reduce the emotional impact of watching your hair fall out.  Don't perm or color your hair during chemotherapy. Those chemical treatments are already damaging to hair and can enhance hair loss. Once your chemo treatments are done and your hair has grown back, it's OK to resume dyeing or perming hair. With chemotherapy, hair loss is almost always temporary. But when it grows back, it may be a different color or texture. In older adults who still had hair color before chemotherapy, the new growth may be completely gray.  Often, new hair is very fine and soft.  Infection Prevention Please wash your hands for at least 30 seconds using warm soapy water. Handwashing is the #1 way to prevent the spread of germs. Stay away from sick people or people who are getting over a cold. If you develop respiratory systems such as green/yellow mucus production or productive cough or persistent cough let us know and we will see if you need an antibiotic. It is a good idea to keep a pair of gloves on when going into grocery stores/[employer]  to decrease your risk of coming into contact with germs on the carts, etc. Carry alcohol hand gel with you at all times and use it frequently if out in public. If your temperature reaches 100.5 or higher please call the clinic and let us know.   If it is after hours or on the weekend please go to the ER if your temperature is over 100.5.  Please have your own personal thermometer at home to use.    Sex and bodily fluids If you are going to have sex, a condom must be used to protect the person that isn't taking chemotherapy. Chemo can decrease your libido (sex drive). For a few days after chemotherapy, chemotherapy can be excreted through your bodily fluids.  When using the toilet please close the lid and flush the toilet twice.  Do this for a few day after you have had chemotherapy.   Effects of chemotherapy on your sex life Some changes are simple and won't last long. They won't affect your sex life permanently. Sometimes you may feel:  too tired  not strong enough to be very active  sick or sore   not in the mood  anxious or low Your anxiety might not seem related to sex. For example, you may be worried about the cancer and how your treatment is going. Or you may be worried about money, or about how you family are coping with your illness. These things can cause stress, which can affect your interest in sex. It's important to talk to your partner about how you feel. Remember - the changes to your sex life don't usually last long. There's usually no medical reason to stop having sex during chemo. The drugs won't have any long term physical effects on your performance or enjoyment of sex. Cancer can't be passed on to your partner during sex  Contraception It's important to use reliable contraception during treatment. Avoid getting pregnant while you or your partner are having chemotherapy. This is because the drugs may harm the baby. Sometimes chemotherapy drugs can leave a man or woman infertile.  This means you would not be able to have children in the future. You might want to talk to someone about permanent infertility. It can be very difficult to learn that you may no longer be able to have children. Some people find counselling  helpful. There might be ways to preserve your fertility, although this is easier for men than for women. You may want to speak to a fertility expert. You can talk about sperm banking or harvesting your eggs. You can also ask about other fertility options, such as donor eggs. If you have or have had breast cancer, your doctor might advise you not to take the contraceptive pill. This is because the hormones in it might affect the cancer.  It is not known for sure whether or not chemotherapy drugs can be passed on through semen or secretions from the vagina. Because of this some doctors advise people to use a barrier method if you have sex during treatment. This applies to vaginal, anal or oral sex. Generally, doctors advise a barrier method only for the time you are actually having the treatment and for about a week after your treatment. Advice like this can be worrying, but this does not mean that you have to avoid being intimate with your partner. You can still have close contact with your partner and continue to enjoy sex.  Animals If you have cats or birds we  just ask that you not change the litter or change the cage.  Please have someone else do this for you while you are on chemotherapy.   Food Safety During and After Cancer Treatment Food safety is important for people both during and after cancer treatment. Cancer and cancer treatments, such as chemotherapy, radiation therapy, and stem cell/bone marrow transplantation, often weaken the immune system. This makes it harder for your body to protect itself from foodborne illness, also called food poisoning. Foodborne illness is caused by eating food that contains harmful bacteria, parasites, or viruses.  Foods to avoid Some foods have a higher risk of becoming tainted with bacteria. These include:  Unwashed fresh fruit and vegetables, especially leafy vegetables that can hide dirt and other contaminants  Raw sprouts, such as alfalfa  sprouts  Raw or undercooked beef, especially ground beef, or other raw or undercooked meat and poultry  Fatty, fried, or spicy foods immediately before or after treatment.  These can sit heavy on your stomach and make you feel nauseous.  Raw or undercooked shellfish, such as oysters.  Sushi and sashimi, which often contain raw fish.   Unpasteurized beverages, such as unpasteurized fruit juices, raw milk, raw yogurt, or cider  Undercooked eggs, such as soft boiled, over easy, and poached; raw, unpasteurized eggs; or foods made with raw egg, such as homemade raw cookie dough and homemade mayonnaise Simple steps for food safety [employer].  Do not buy food stored or displayed in an unclean area.  Do not buy bruised or damaged fruits or vegetables.  Do not buy cans that have cracks, dents, or bulges.  Pick up foods that can spoil at the end of your shopping trip and store them in a cooler on the way home. Prepare and clean up foods carefully.  Rinse all fresh fruits and vegetables under running water, and dry them with a clean towel or paper towel.  Clean the top of cans before opening them.  After preparing food, wash your hands for 20 seconds with hot water and soap. Pay special attention to areas between fingers and under nails.  Clean your utensils and dishes with hot water and soap.  Disinfect your kitchen and cutting boards using 1 teaspoon of liquid, unscented bleach mixed into 1 quart of water.   Dispose of old food.  Eat canned and packaged food before its expiration date (the use by or best before date).  Consume refrigerated leftovers within 3 to 4 days. After that time, throw out the food. Even if the food does not smell or look spoiled, it still may be unsafe. Some bacteria, such as Listeria, can grow even on foods stored in the refrigerator if they are kept for too long. Take precautions when eating out.  At restaurants, avoid buffets and salad bars where food  sits out for a long time and comes in contact with many people. Food can become contaminated when someone with a virus, often a norovirus, or another bug handles it.  Put any leftover food in a to-go container yourself, rather than having the server do it. And, refrigerate leftovers as soon as you get home.  Choose restaurants that are clean and that are willing to prepare your food as you order it cooked.   MEDICATIONS:  Compazine/Prochlorperazine 10mg  tablet. Take 1 tablet every 6 hours as needed for nausea/vomiting. (This can make you sleepy)   EMLA cream. Apply a quarter size amount to port site 1 hour prior to chemo. Do not rub in. Cover with plastic wrap.   Over-the-Counter Meds:  Colace - 100 mg capsules - take 2 capsules daily.  If this doesn't help then you can increase to 2 capsules twice daily.  Call us if this does not help your bowels move.   Imodium 2mg  capsule. Take 2 capsules after the 1st loose stool and then 1 capsule every 2 hours until you go a total of 12 hours without having a loose stool. Call the [facility] if loose stools continue. If diarrhea occurs at bedtime, take 2 capsules at bedtime. Then take 2 capsules every 4 hours until morning. Call [facility].    Diarrhea Sheet   If you are having loose stools/diarrhea, please purchase Imodium and begin taking as outlined:  At the first sign of poorly formed or loose stools you should begin taking Imodium (loperamide) 2 mg capsules.  Take two caplets (4mg ) followed by one caplet (2mg ) every 2 hours until you have had no diarrhea for 12 hours.  During the night take two caplets (4mg ) at bedtime and continue every 4 hours during the night until the morning.  Stop taking Imodium only after there is no sign of diarrhea for 12 hours.    Always call the  [facility] if you are having loose stools/diarrhea that you can't get under control.  Loose stools/diarrhea leads to dehydration (loss of water) in your body.  We have other options of trying to get the loose stools/diarrhea to stop but you must let us know!   Constipation Sheet  Colace - 100 mg capsules - take 2 capsules daily.  If this doesn't help then you can increase to 2 capsules twice daily.  Please call if the above does not work for you.   Do not go more than 2 days without a bowel movement.  It is very important that you do not become constipated.  It will make you feel sick to your stomach (nausea) and can cause abdominal pain and vomiting.   Nausea Sheet   Compazine/Prochlorperazine 10mg  tablet. Take 1 tablet every 6 hours as needed for nausea/vomiting. (This can make you sleepy)  If you are having persistent nausea (nausea that does not stop) please call the [facility] and let us know the amount of nausea that you are experiencing.  If you begin to vomit, you need to call the [facility] and if it is the weekend and you have vomited more than one time and can't get it to stop-go to the Emergency Room.  Persistent nausea/vomiting can lead to dehydration (loss of fluid in your body) and will make you feel terrible.   Ice chips, sips of clear liquids, foods that are @ room temperature, crackers, and toast tend to be better tolerated.   SYMPTOMS TO REPORT AS SOON AS POSSIBLE AFTER TREATMENT:   FEVER GREATER THAN 100.5 F  CHILLS WITH OR WITHOUT FEVER  NAUSEA AND VOMITING THAT IS NOT CONTROLLED WITH YOUR NAUSEA MEDICATION  UNUSUAL SHORTNESS OF BREATH  UNUSUAL BRUISING OR BLEEDING  TENDERNESS IN MOUTH AND THROAT WITH OR WITHOUT PRESENCE OF ULCERS  URINARY PROBLEMS  BOWEL PROBLEMS  UNUSUAL RASH      Wear comfortable clothing and clothing appropriate for easy access to any Portacath or PICC line. Let us  know if there is anything that we can do to make your  therapy better!    What to do if you need assistance after hours or on the weekends: CALL [phone].  HOLD on the line, do not hang up.  You will hear multiple messages but at the end you will be connected with a nurse triage line.  They will contact the doctor if necessary.  Most of the time they will be able to assist you.  Do not call the hospital operator.      I have been informed and understand all of the instructions given to me and have received a copy. I have been instructed to call the clinic [phone] or my family physician as soon as possible for continued medical care, if indicated. I do not have any more questions at this time but understand that I may call the [facility] or the Patient Navigator at [phone] during office hours should I have questions or need assistance in obtaining follow-up care.

## 2018-08-02 NOTE — Progress Notes (Signed)
Chemotherapy teaching pulled together. 

## 2018-08-02 DEATH — deceased

## 2019-09-30 IMAGING — DX DG CHEST 2V
2 series · 2 of 2 positions shown · non-contrast
Comparison: None.

CLINICAL DATA: Weakness.  Low white cell count.

EXAM:
CHEST - 2 VIEW

[chest pa]
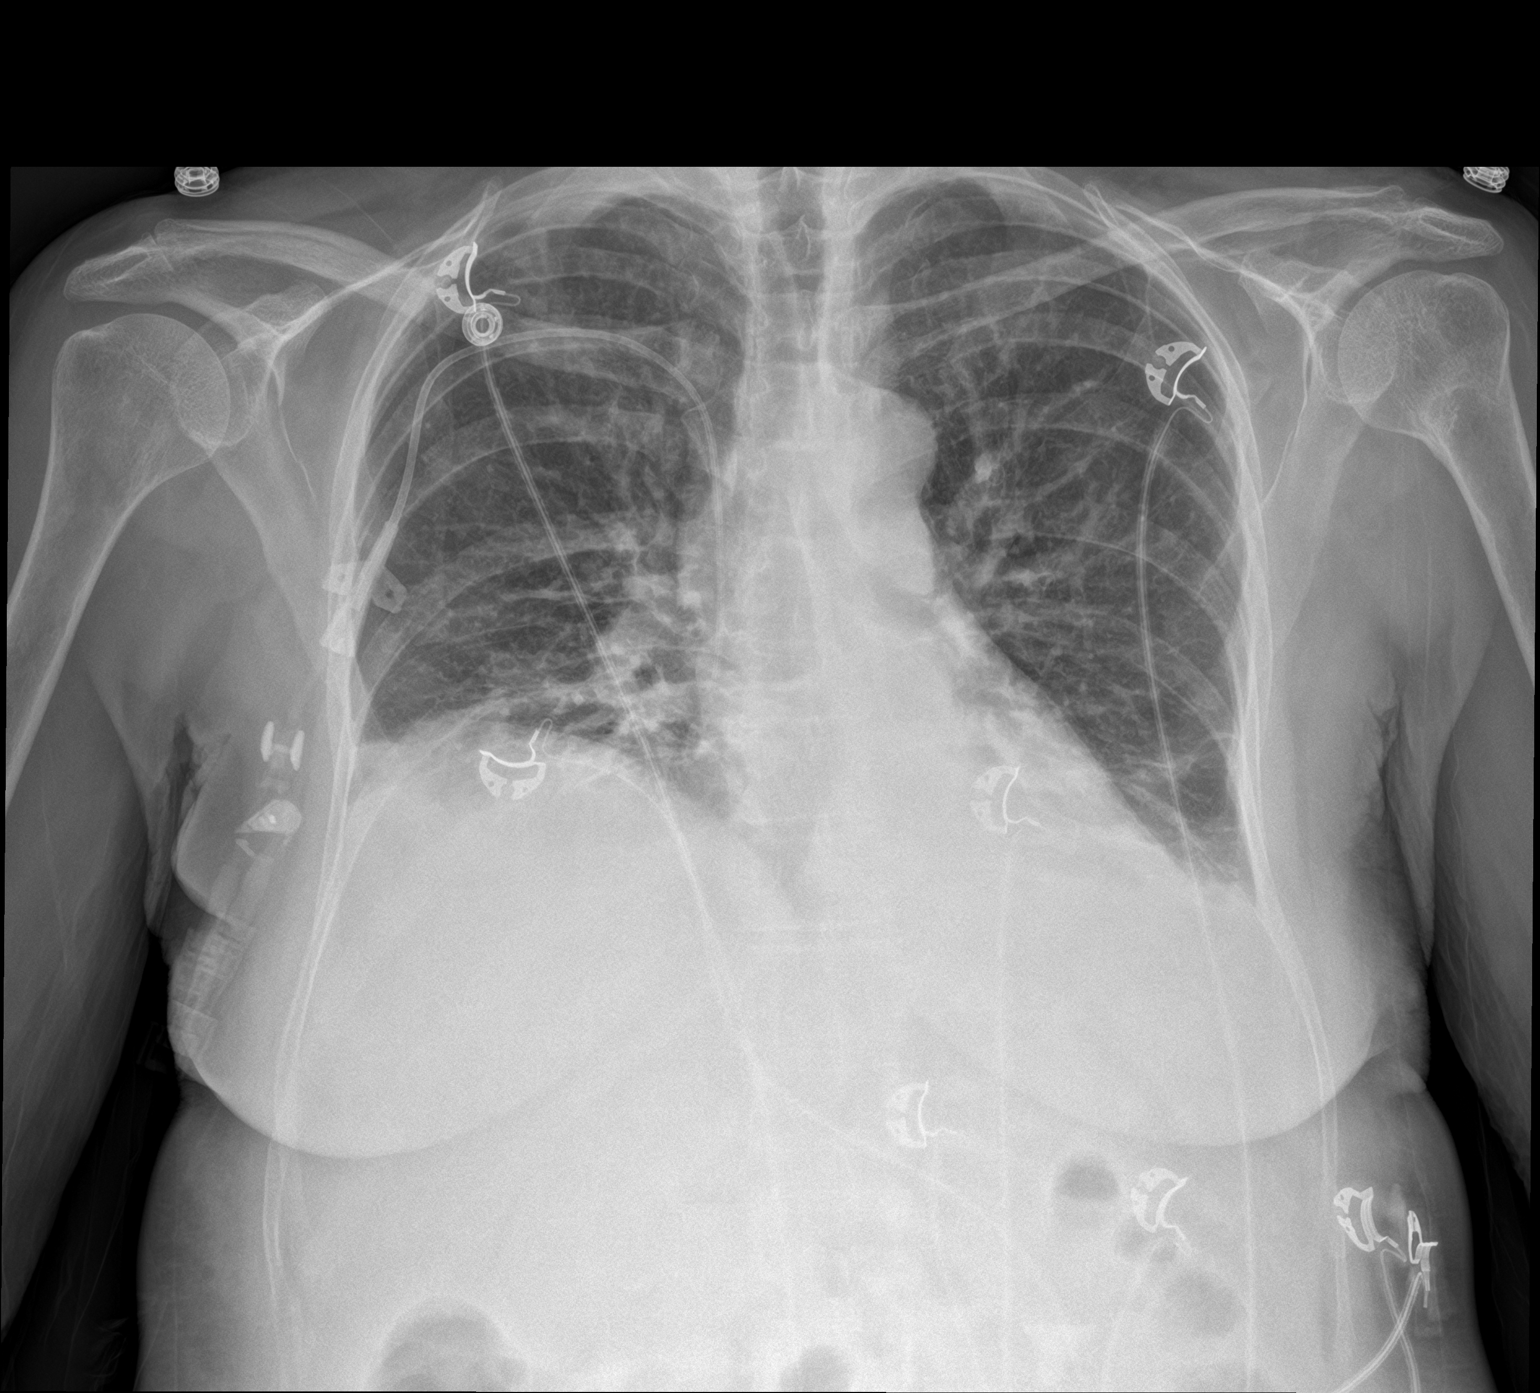

[chest lat]
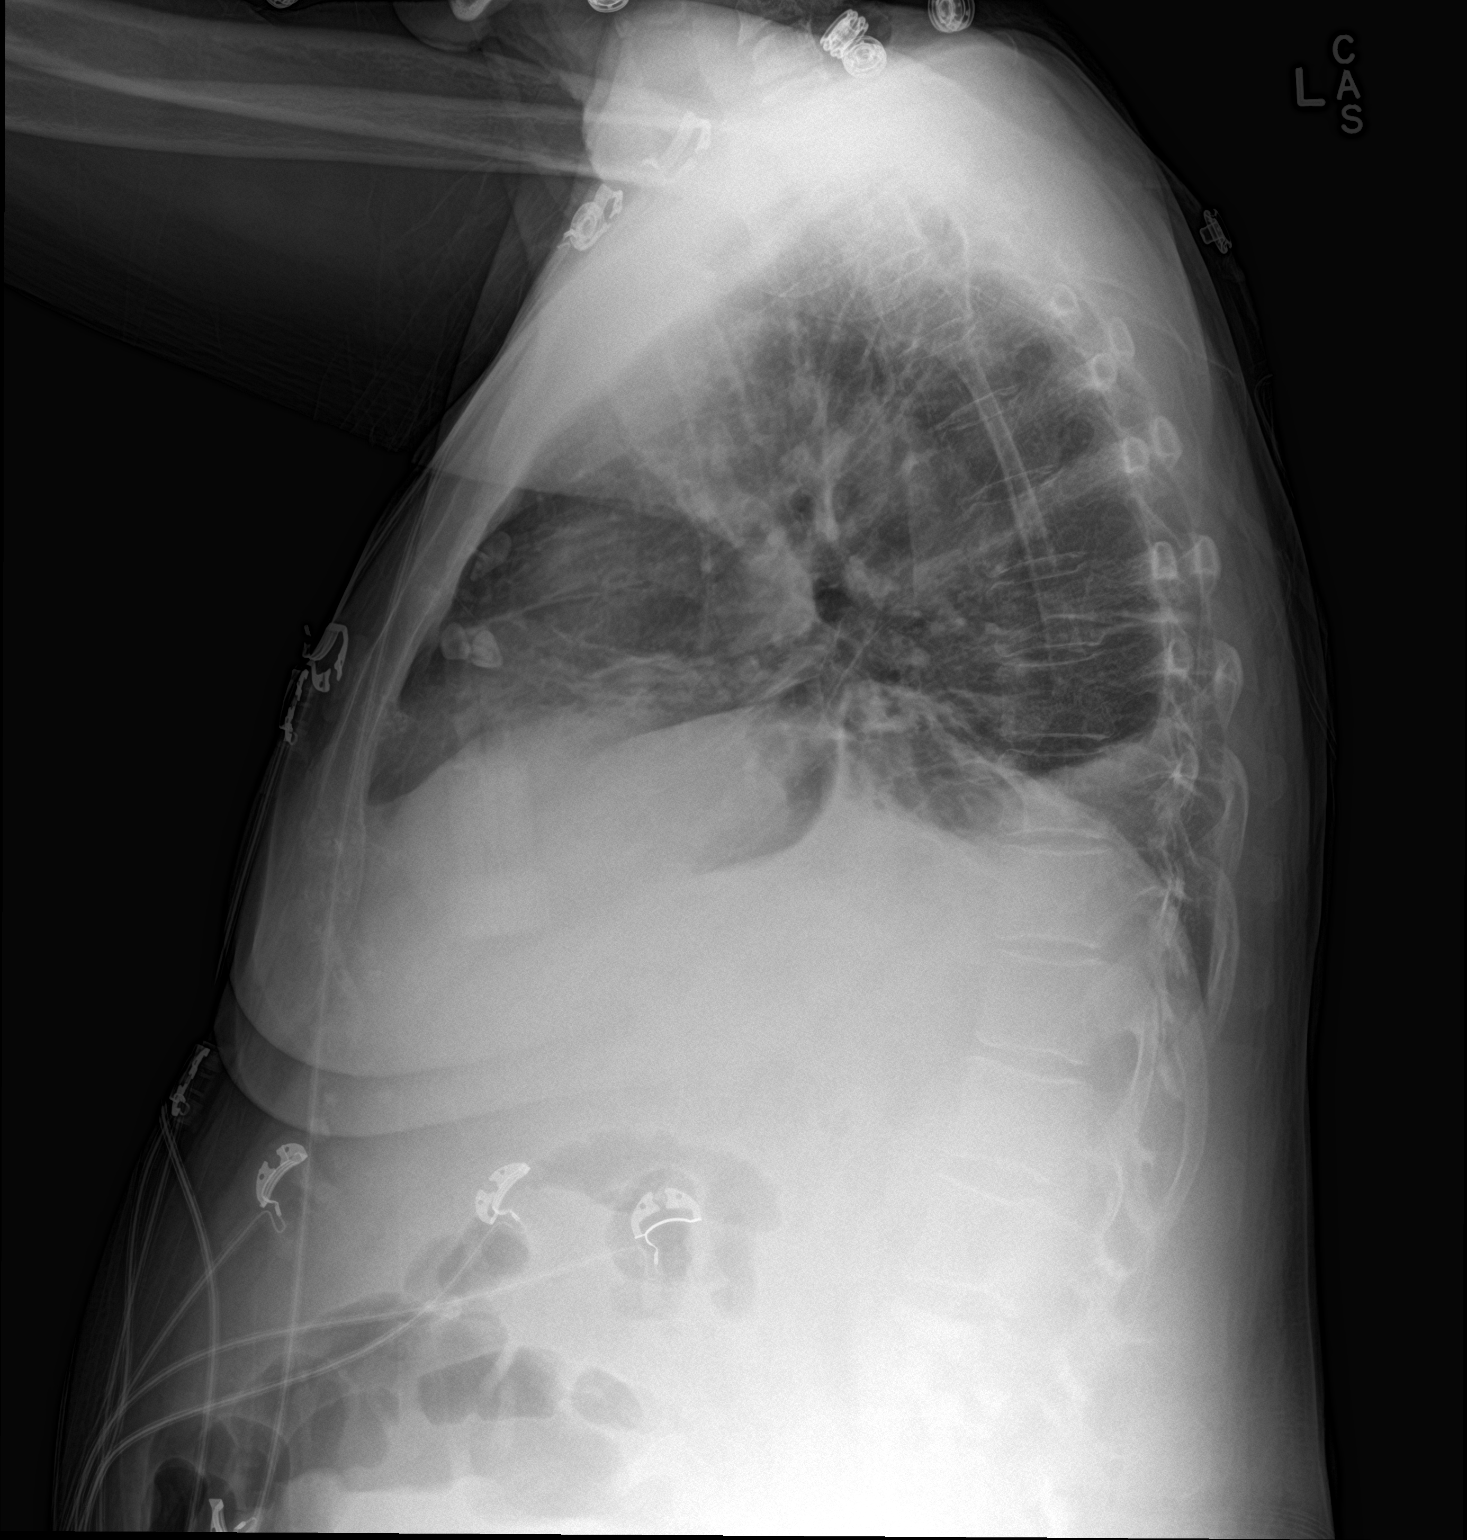

[2 of 2 positions shown; findings below may reference images not displayed]

FINDINGS: Injectable port terminates at the expected location of the
cavoatrial junction.

Cardiomediastinal silhouette is normal. Mediastinal contours appear
intact.

Low lung volumes. Bilateral pleural effusions with bilateral lower
lobe atelectasis versus peribronchial airspace consolidation.

Osseous structures are without acute abnormality. Soft tissues are
grossly normal.
IMPRESSION: Bilateral pleural effusions with bilateral lower lobe atelectasis
versus peribronchial airspace consolidation.

## 2019-09-30 IMAGING — CT CT ABD-PELV W/ CM
2 of 4 series · 16 of 46 positions shown, 18 images · IV contrast (Isovue)
Comparison: None.

CLINICAL DATA: 62 y/o F; malignant mixed mullerian tumor on
chemotherapy. Stoma with possible abscess or infection.

EXAM:
CT ABDOMEN AND PELVIS WITH CONTRAST
TECHNIQUE: Multidetector CT imaging of the abdomen and pelvis was performed
using the standard protocol following bolus administration of
intravenous contrast.
CONTRAST:  100mL F2R2Q8-ONN IOPAMIDOL (F2R2Q8-ONN) INJECTION 61%

[Series 2: axial st · axial · 0.92mm/px · z∈[+873,+1313]mm · 13 of 98 slices shown, 15 images]
[im 5/98  soft-tissue]
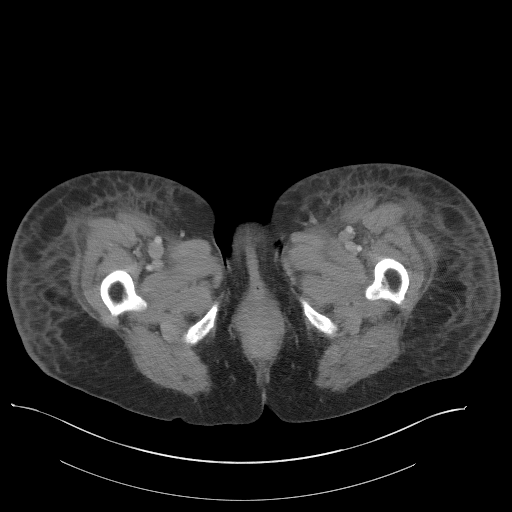
[im 5/98  bone]
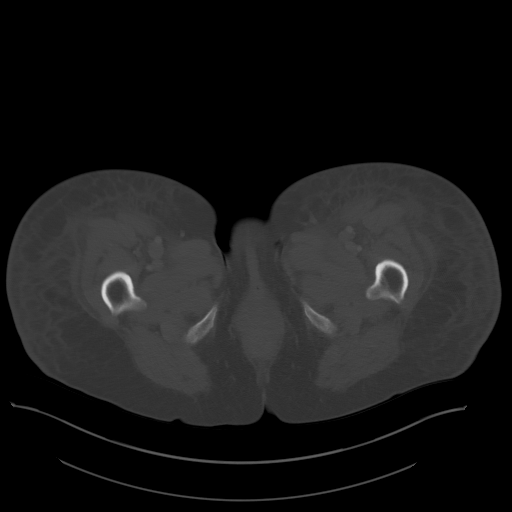
[im 14/98  soft-tissue]
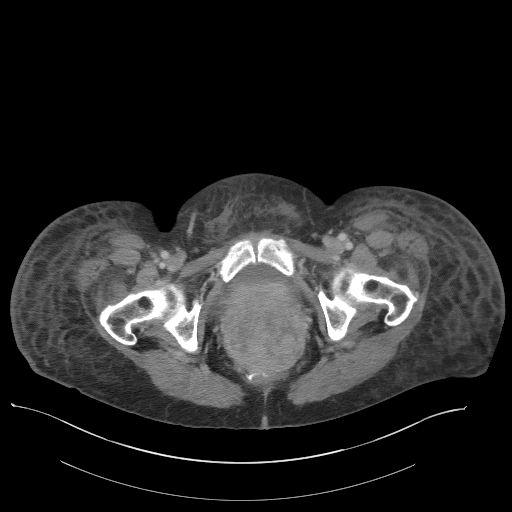
[im 23/98  soft-tissue]
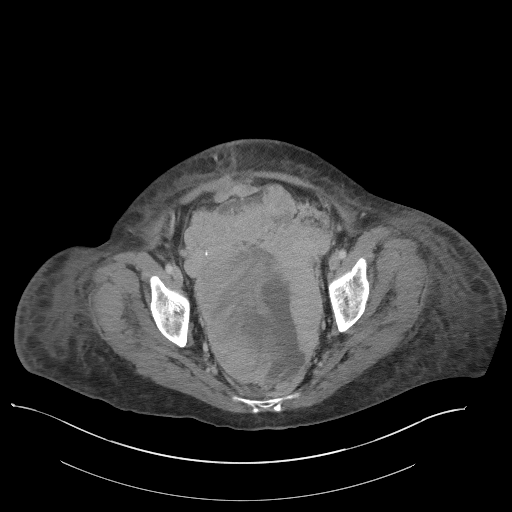
[im 27/98  soft-tissue]
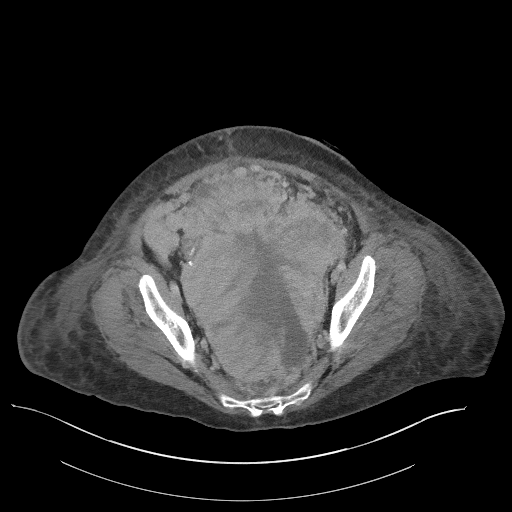
[im 36/98  soft-tissue]
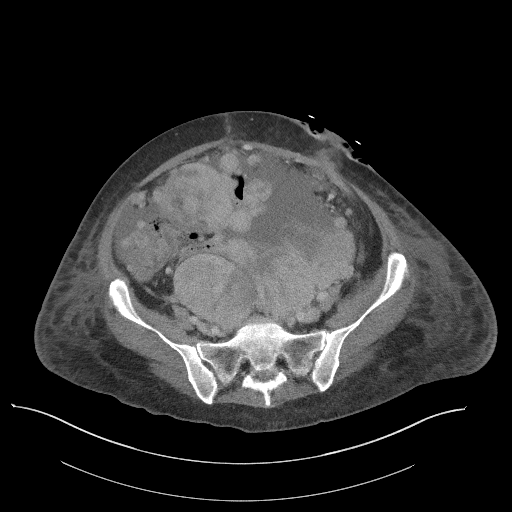
[im 40/98  soft-tissue]
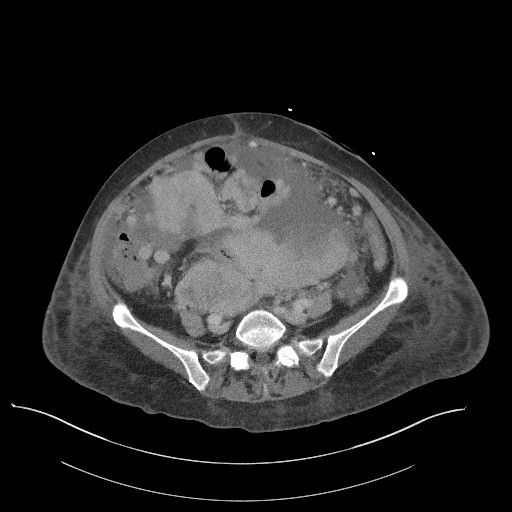
[im 49/98  soft-tissue]
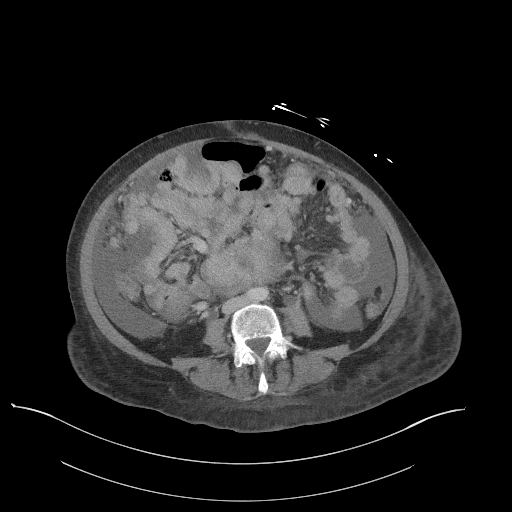
[im 58/98  soft-tissue]
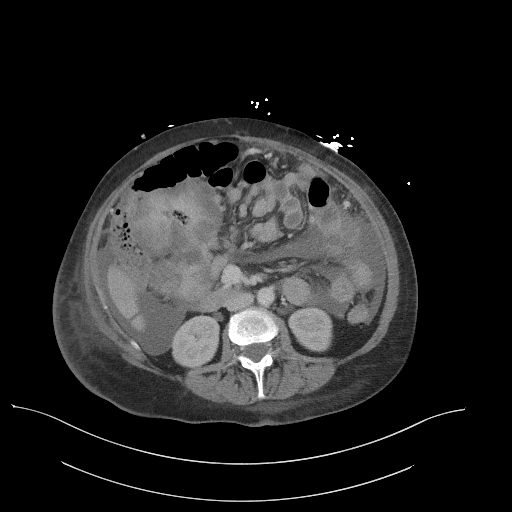
[im 62/98  soft-tissue]
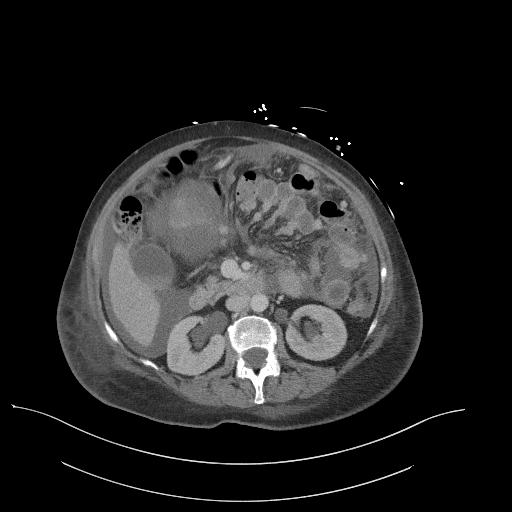
[im 62/98  bone]
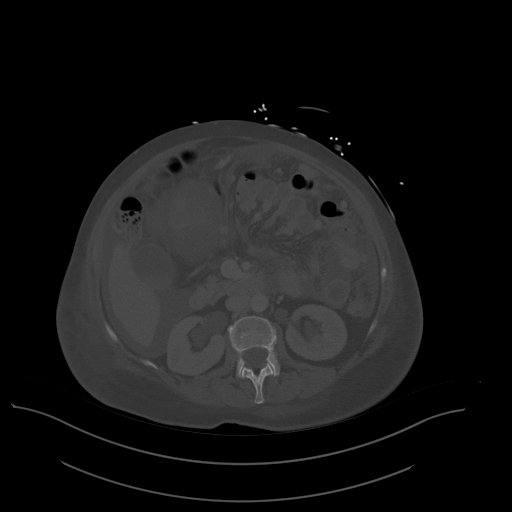
[im 71/98  soft-tissue]
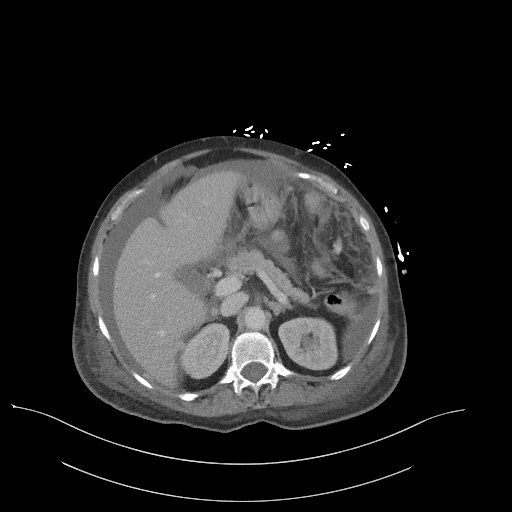
[im 75/98  soft-tissue]
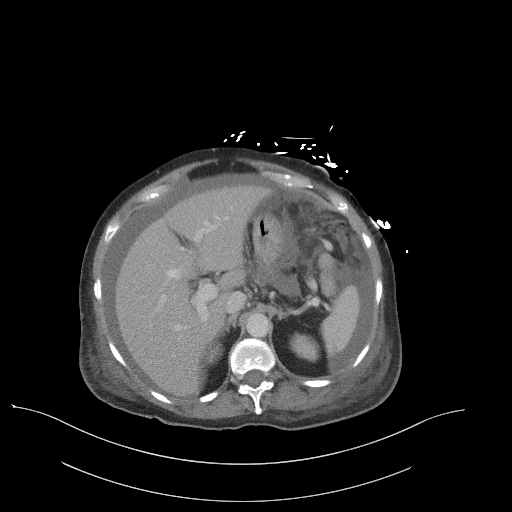
[im 84/98  soft-tissue]
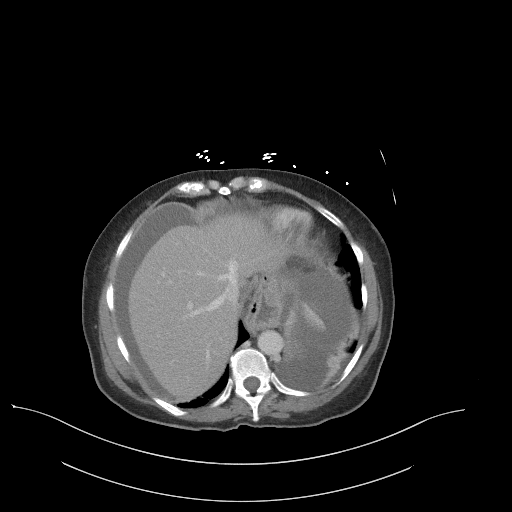
[im 93/98  soft-tissue]
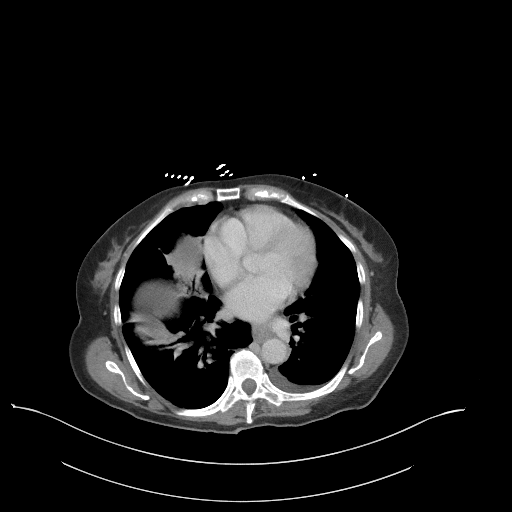

[Series 5: coronal st · coronal · 0.85mm/px · 3 of 98 slices shown]
[im 33/98  soft-tissue]
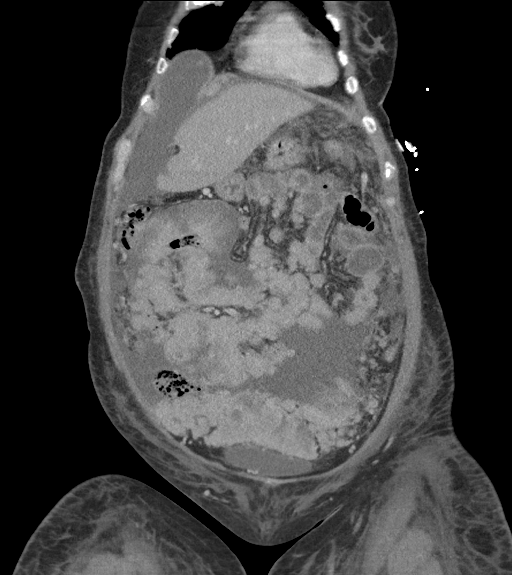
[im 44/98  soft-tissue]
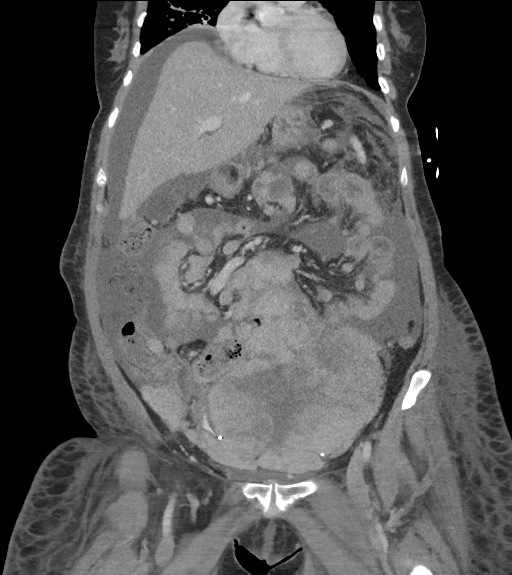
[im 54/98  soft-tissue]
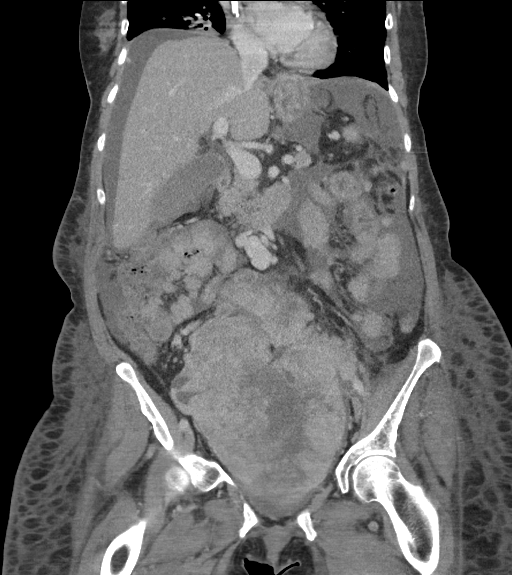

[16 of 46 positions shown; findings below may reference images not displayed]

FINDINGS: Lower chest: Linear opacities of the lung bases compatible with
minor atelectasis. Small left pleural effusion.

Hepatobiliary: No focal liver abnormality is seen. Dependent
increased attenuation within the gallbladder likely representing
cholelithiasis. No biliary ductal dilatation.

Pancreas: Unremarkable. No pancreatic ductal dilatation or
surrounding inflammatory changes.

Spleen: Normal in size without focal abnormality.

Adrenals/Urinary Tract: No adrenal glands. No focal kidney lesion.
Mild bilateral hydronephrosis mass effect on the distal ureters by
the pelvic mass. Bladder is not distended and anterior displaced.

Stomach/Bowel: No bowel obstruction or inflammatory changes
identified. Left lower quadrant colostomy. No discrete rim enhancing
collection within the abdominal wall adjacent to the ostomy. There
is an 11 mm nodule inferior to the ostomy in the abdominal wall
(series 5, image 15) probably representing a metastatic deposit.

Vascular: No significant vascular findings.

Reproductive: Pelvic structures not visualized due to tumor.

Other: Pelvic mass measuring 19 x 15 x 22 cm (AP x ML x CC series 2,
image 71 and series 6, image 65) filling the entirety of the pelvis.
Extensive peritoneal and mesenteric carcinomatosis with innumerable
nodules throughout the mesentery, omentum, and within the peritoneal
gutters. Extensive tumor caking of small bowel throughout the mid
abdomen. Moderate volume of ascites.

Musculoskeletal: No acute or significant osseous findings.
IMPRESSION: 1. Left lower quadrant colostomy. No rim enhancing collection
adjacent to the colostomy in the abdominal wall to suggest abscess.
2. Pelvic mass measuring up to 22 cm occupying the entirety of
pelvis and extending into lower abdomen. Extensive peritoneal
carcinomatosis. Moderate volume of ascites. Small probable
metastatic deposit within the abdominal wall adjacent to the
colostomy.
3. Mild bilateral hydronephrosis with mass effect on the distal
ureters by the pelvic mass.
4. Bilateral lower lobe platelike atelectasis. Small left pleural
effusion.
5. Cholelithiasis.

By: Nunurai Ignatia M.D.

## 2020-05-01 IMAGING — US US EXTREM LOW VENOUS BILAT
1 series · 13 of 24 positions shown · non-contrast
Comparison: None.

CLINICAL DATA: Bilateral lower extremity edema.



[Series 1: us extrem low venous bilat · 0.08mm/px · 13 of 100 slices shown]
[im 1/100]
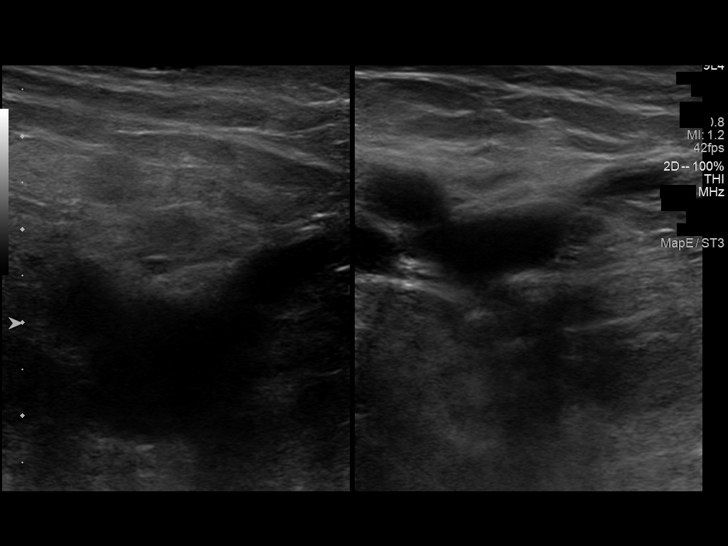
[im 9/100]
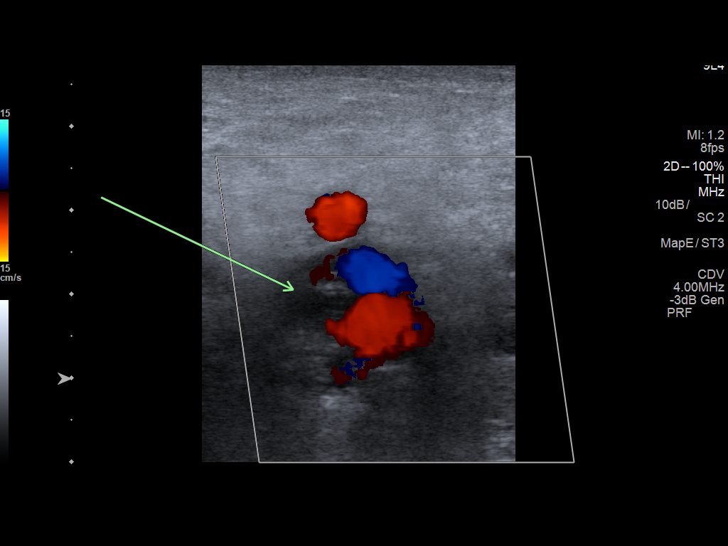
[im 18/100]
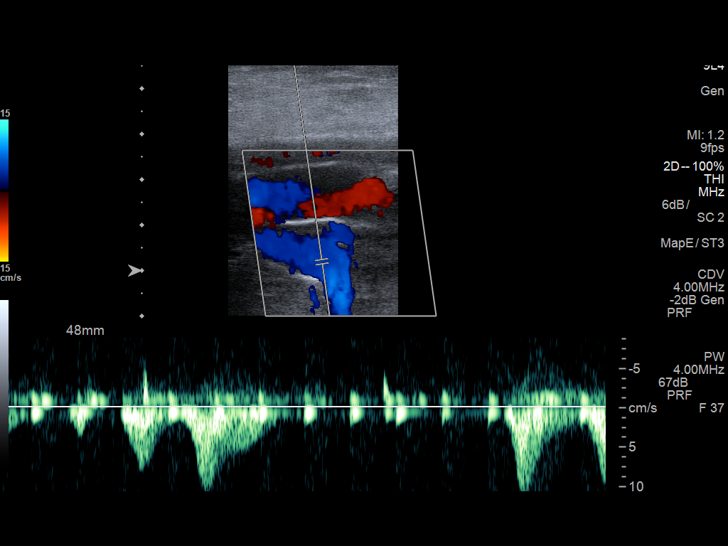
[im 26/100]
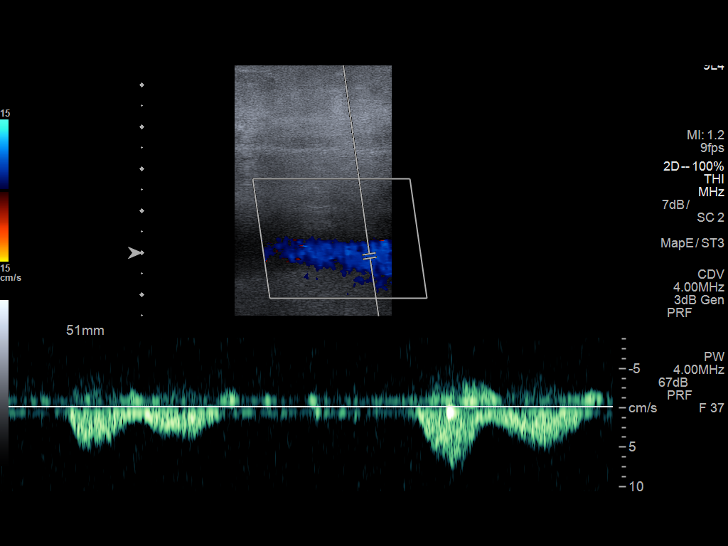
[im 35/100]
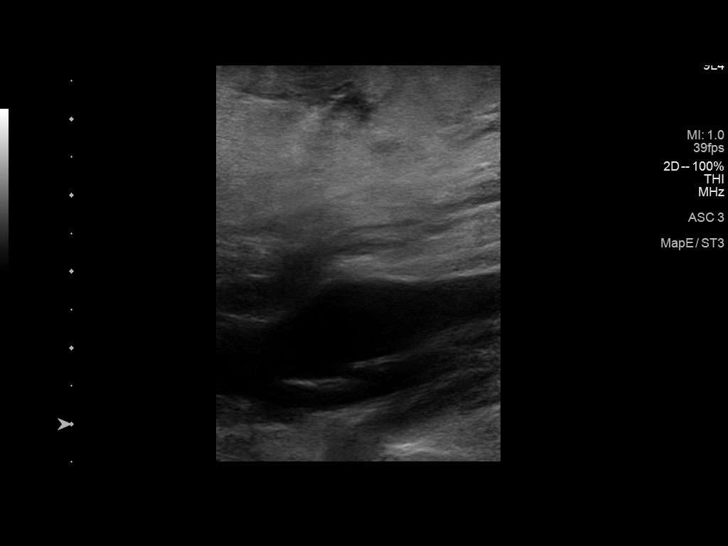
[im 44/100]
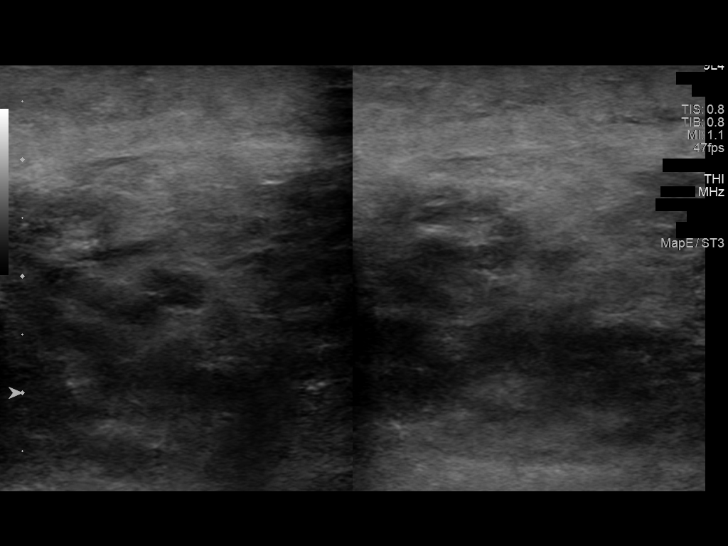
[im 52/100]
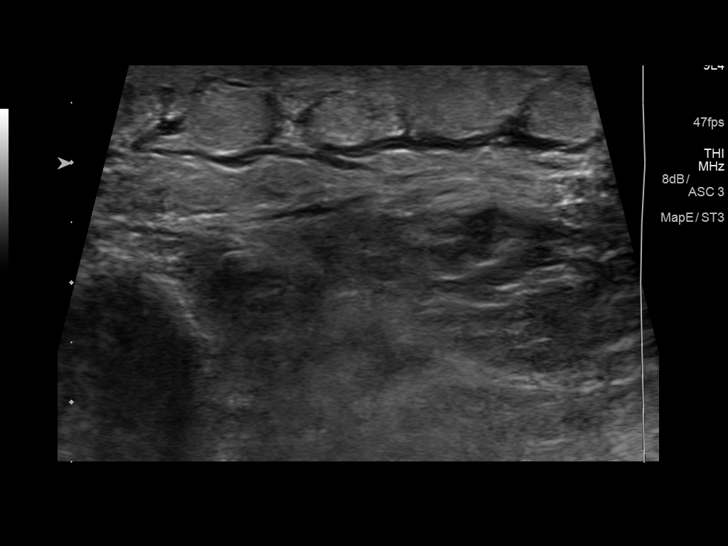
[im 56/100]
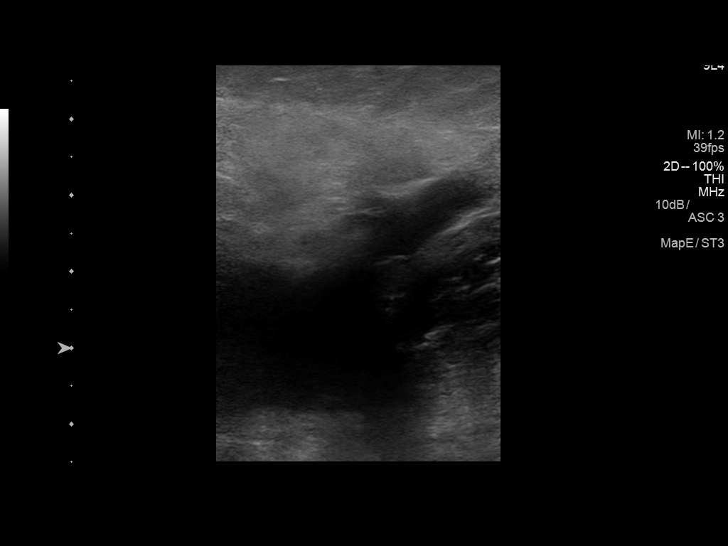
[im 65/100]
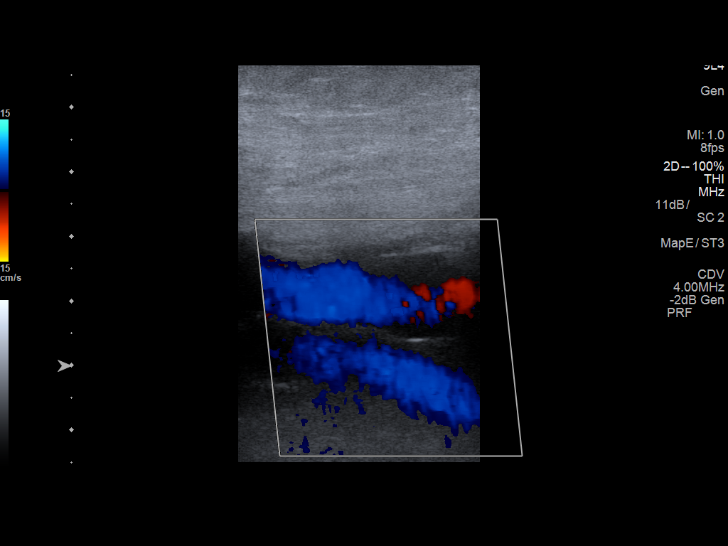
[im 74/100]
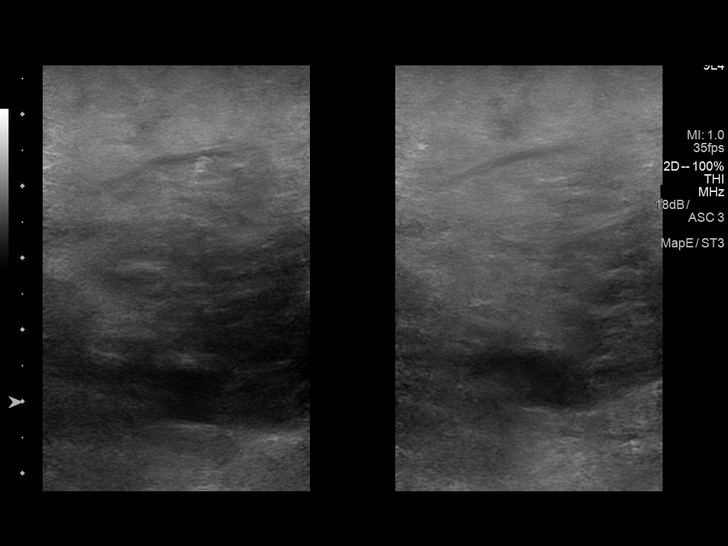
[im 82/100]
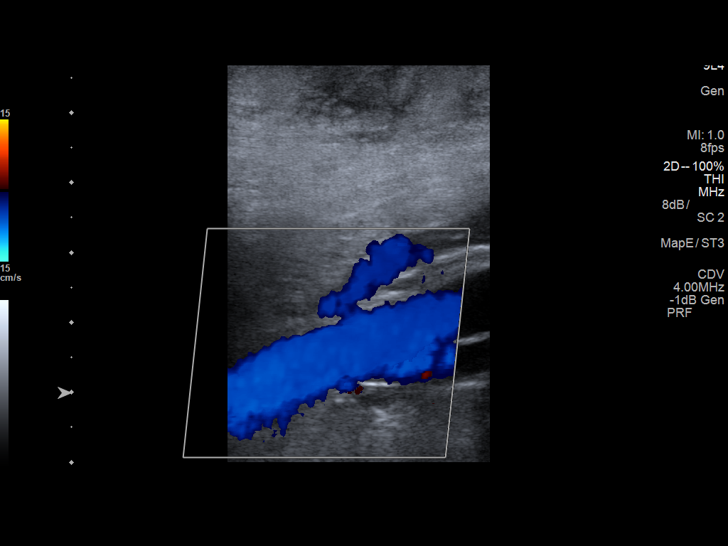
[im 91/100]
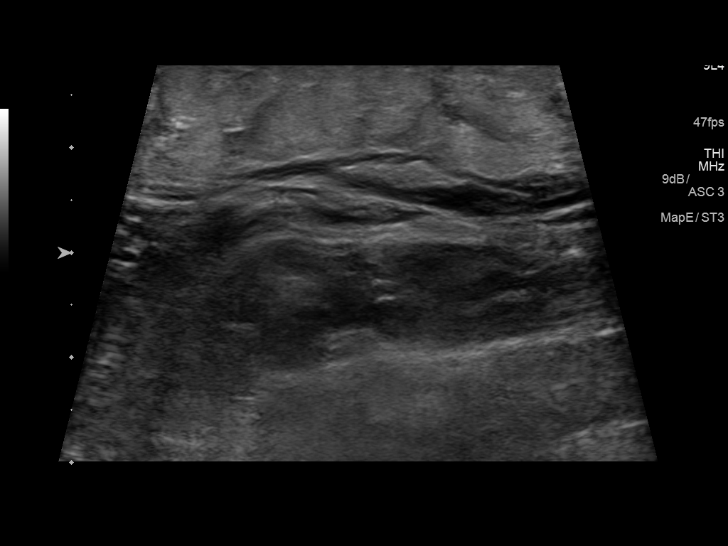
[im 100/100]
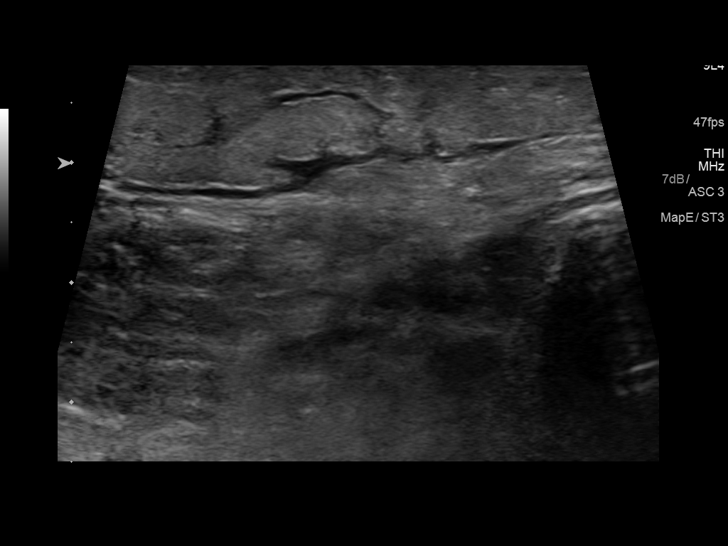

[13 of 24 positions shown; findings below may reference images not displayed]

FINDINGS: RIGHT LOWER EXTREMITY

Common Femoral Vein: No evidence of thrombus. Normal
compressibility, respiratory phasicity and response to augmentation.

Saphenofemoral Junction: No evidence of thrombus. Normal
compressibility and flow on color Doppler imaging.

Profunda Femoral Vein: No evidence of thrombus. Normal
compressibility and flow on color Doppler imaging.

Femoral Vein: Nonocclusive thrombus proximally.

Popliteal Vein: No evidence of thrombus. Normal compressibility,
respiratory phasicity and response to augmentation.

Calf Veins: No evidence of thrombus. Normal compressibility and flow
on color Doppler imaging.

Superficial Great Saphenous Vein: No evidence of thrombus. Normal
compressibility.

Venous Reflux:  None.

Other Findings:  None.

LEFT LOWER EXTREMITY

Common Femoral Vein: No evidence of thrombus. Normal
compressibility, respiratory phasicity and response to augmentation.

Saphenofemoral Junction: No evidence of thrombus. Normal
compressibility and flow on color Doppler imaging.

Profunda Femoral Vein: No evidence of thrombus. Normal
compressibility and flow on color Doppler imaging.

Femoral Vein: No evidence of thrombus. Normal compressibility,
respiratory phasicity and response to augmentation.

Popliteal Vein: No evidence of thrombus. Normal compressibility,
respiratory phasicity and response to augmentation.

Calf Veins: No evidence of thrombus. Normal compressibility and flow
on color Doppler imaging.

Superficial Great Saphenous Vein: No evidence of thrombus. Normal
compressibility.

Venous Reflux:  None.

Other Findings:  None.
IMPRESSION: 1. Nonocclusive thrombus within the right proximal femoral vein.
2. No evidence of left deep venous thrombosis.

These results will be called to the ordering clinician or
representative by the Radiologist Assistant, and communication
documented in the PACS or zVision Dashboard.
